# Patient Record
Sex: Female | Born: 1985 | Race: Black or African American | Hispanic: No | Marital: Single | Smoking: Current every day smoker
Health system: Southern US, Community
[De-identification: ages and names within clinical notes are randomized; demographics above are authoritative.]

## PROBLEM LIST (undated history)

## (undated) DIAGNOSIS — E669 Obesity, unspecified: Secondary | ICD-10-CM

## (undated) DIAGNOSIS — F319 Bipolar disorder, unspecified: Secondary | ICD-10-CM

## (undated) DIAGNOSIS — F419 Anxiety disorder, unspecified: Secondary | ICD-10-CM

## (undated) DIAGNOSIS — F259 Schizoaffective disorder, unspecified: Secondary | ICD-10-CM

## (undated) DIAGNOSIS — F250 Schizoaffective disorder, bipolar type: Secondary | ICD-10-CM

## (undated) HISTORY — PX: DENTAL SURGERY: SHX609

---
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
## 2005-12-23 ENCOUNTER — Emergency Department (HOSPITAL_COMMUNITY): Admission: EM | Admit: 2005-12-23 | Discharge: 2005-12-23 | Payer: Self-pay | Admitting: *Deleted

## 2007-10-11 ENCOUNTER — Emergency Department (HOSPITAL_COMMUNITY): Admission: EM | Admit: 2007-10-11 | Discharge: 2007-10-11 | Payer: Self-pay | Admitting: Emergency Medicine

## 2008-07-15 ENCOUNTER — Emergency Department (HOSPITAL_COMMUNITY): Admission: EM | Admit: 2008-07-15 | Discharge: 2008-07-15 | Payer: Self-pay | Admitting: Emergency Medicine

## 2009-03-03 ENCOUNTER — Emergency Department (HOSPITAL_COMMUNITY): Admission: EM | Admit: 2009-03-03 | Discharge: 2009-03-03 | Payer: Self-pay | Admitting: Emergency Medicine

## 2009-03-14 ENCOUNTER — Emergency Department (HOSPITAL_COMMUNITY): Admission: EM | Admit: 2009-03-14 | Discharge: 2009-03-14 | Payer: Self-pay | Admitting: Family Medicine

## 2009-04-12 ENCOUNTER — Emergency Department (HOSPITAL_COMMUNITY): Admission: EM | Admit: 2009-04-12 | Discharge: 2009-04-13 | Payer: Self-pay | Admitting: Emergency Medicine

## 2009-07-12 ENCOUNTER — Emergency Department (HOSPITAL_COMMUNITY): Admission: EM | Admit: 2009-07-12 | Discharge: 2009-07-13 | Payer: Self-pay | Admitting: Emergency Medicine

## 2011-01-27 LAB — URINE MICROSCOPIC-ADD ON

## 2011-01-27 LAB — WET PREP, GENITAL
Trich, Wet Prep: NONE SEEN
Yeast Wet Prep HPF POC: NONE SEEN

## 2011-01-27 LAB — RPR: RPR Ser Ql: NONREACTIVE

## 2011-01-27 LAB — URINALYSIS, ROUTINE W REFLEX MICROSCOPIC
Specific Gravity, Urine: 1.027 (ref 1.005–1.030)
pH: 6 (ref 5.0–8.0)

## 2011-01-27 LAB — POCT PREGNANCY, URINE: Preg Test, Ur: NEGATIVE

## 2011-01-27 LAB — GC/CHLAMYDIA PROBE AMP, GENITAL: Chlamydia, DNA Probe: POSITIVE — AB

## 2011-01-30 LAB — URINALYSIS, ROUTINE W REFLEX MICROSCOPIC
Glucose, UA: NEGATIVE mg/dL
Nitrite: NEGATIVE
Protein, ur: NEGATIVE mg/dL
Urobilinogen, UA: 0.2 mg/dL (ref 0.0–1.0)

## 2011-01-30 LAB — POCT PREGNANCY, URINE: Preg Test, Ur: NEGATIVE

## 2011-01-31 LAB — CBC
MCHC: 35 g/dL (ref 30.0–36.0)
MCV: 94.4 fL (ref 78.0–100.0)
RBC: 3.66 MIL/uL — ABNORMAL LOW (ref 3.87–5.11)
WBC: 6 10*3/uL (ref 4.0–10.5)

## 2011-01-31 LAB — DIFFERENTIAL
Basophils Absolute: 0 10*3/uL (ref 0.0–0.1)
Eosinophils Absolute: 0 10*3/uL (ref 0.0–0.7)
Lymphs Abs: 1.7 10*3/uL (ref 0.7–4.0)
Neutro Abs: 3.6 10*3/uL (ref 1.7–7.7)

## 2011-01-31 LAB — COMPREHENSIVE METABOLIC PANEL
AST: 32 U/L (ref 0–37)
Albumin: 3.8 g/dL (ref 3.5–5.2)
BUN: 6 mg/dL (ref 6–23)
CO2: 25 mEq/L (ref 19–32)
GFR calc Af Amer: >60 mL/min (ref >60)
GFR calc non Af Amer: >60 mL/min (ref >60)
Glucose, Bld: 83 mg/dL (ref 70–99)
Total Protein: 6.5 g/dL (ref 6.0–8.3)

## 2011-01-31 LAB — URINALYSIS, ROUTINE W REFLEX MICROSCOPIC
Glucose, UA: NEGATIVE mg/dL
Nitrite: NEGATIVE

## 2011-01-31 LAB — LIPASE, BLOOD: Lipase: 23 U/L (ref 11–59)

## 2011-07-28 LAB — URINALYSIS, ROUTINE W REFLEX MICROSCOPIC
Bilirubin Urine: NEGATIVE
Glucose, UA: NEGATIVE
Ketones, ur: NEGATIVE
Leukocytes, UA: NEGATIVE
Nitrite: NEGATIVE
Specific Gravity, Urine: 1.021
Urobilinogen, UA: 1

## 2011-07-28 LAB — GC/CHLAMYDIA PROBE AMP, GENITAL: GC Probe Amp, Genital: NEGATIVE

## 2011-07-28 LAB — POCT PREGNANCY, URINE
Operator id: 285841
Preg Test, Ur: NEGATIVE

## 2011-11-16 ENCOUNTER — Emergency Department (HOSPITAL_COMMUNITY)
Admission: EM | Admit: 2011-11-16 | Discharge: 2011-11-16 | Disposition: A | Discharge status: Home / Self Care | Payer: Medicaid Other | Attending: Emergency Medicine | Admitting: Emergency Medicine

## 2011-11-16 ENCOUNTER — Encounter (HOSPITAL_COMMUNITY): Payer: Self-pay | Admitting: Emergency Medicine

## 2011-11-16 DIAGNOSIS — E669 Obesity, unspecified: Secondary | ICD-10-CM | POA: Insufficient documentation

## 2011-11-16 DIAGNOSIS — L02411 Cutaneous abscess of right axilla: Secondary | ICD-10-CM

## 2011-11-16 DIAGNOSIS — IMO0002 Reserved for concepts with insufficient information to code with codable children: Secondary | ICD-10-CM | POA: Insufficient documentation

## 2011-11-16 MED ORDER — IBUPROFEN 200 MG PO TABS
600.0000 mg | ORAL_TABLET | Freq: Once | ORAL | Status: AC
  Administered 2011-11-16: 600 mg via ORAL
  Filled 2011-11-16: qty 3

## 2011-11-16 MED ORDER — DOXYCYCLINE HYCLATE 100 MG PO CAPS: 100.0000 mg | ORAL_CAPSULE | Freq: Two times a day (BID) | ORAL | Status: AC

## 2011-11-16 NOTE — ED Provider Notes (Signed)
History     CSN: [id number]  Arrival date & time 11/16/11  2004   First MD Initiated Contact with Patient 11/16/11 2017      Chief Complaint  Patient presents with  . Abscess     Patient is a 26 y.o. female presenting with abscess.  Abscess  Pertinent negatives include no fever.   History provided by the patient. Patient is a 26 year old female with no significant past medical history who presents with complaints of boil to her right axilla over the past 4-5 days. Patient reports having gradual increase of pain and swelling to the area. She does report some episodes of drainage and bleeding to the area with improvement of symptoms however she reports pain and swelling has route increased over the past few days. Patient denies any fever, chills, sweats, nausea or vomiting. Patient does report having some soreness in her shoulder and having a headache today. Patient has not taken any medications for her symptoms. Pain is aggravated with movements of arms and palpitations to the area. Patient denies any alleviating factors. Patient has no other significant medical history or complaints.    History reviewed. No pertinent past medical history.  History reviewed. No pertinent past surgical history.  No family history on file.  History  Substance Use Topics  . Smoking status: Never Smoker   . Smokeless tobacco: Not on file  . Alcohol Use: No    OB History    Grav Para Term Preterm Abortions TAB SAB Ect Mult Living                  Review of Systems  Constitutional: Negative for fever and chills.  All other systems reviewed and are negative.    Allergies  Vicodin  Home Medications  No current outpatient prescriptions on file.  BP 114/64  Pulse 108  Temp(Src) 98.1 F (36.7 C) (Oral)  Resp 18  SpO2 100%  LMP 10/24/2011  Physical Exam  Nursing note and vitals reviewed. Constitutional: She is oriented to person, place, and time. She appears well-developed and  well-nourished. No distress.  HENT:  Head: Normocephalic.  Cardiovascular: Normal rate and regular rhythm.   Pulmonary/Chest: Effort normal and breath sounds normal.  Abdominal:       Obese  Neurological: She is alert and oriented to person, place, and time.  Skin: Skin is warm and dry.       3 cm area of induration to right axilla. There is a central area with fluctuance. No active drainage or bleeding.  Psychiatric: She has a normal mood and affect. Her behavior is normal.    ED Course  Procedures   INCISION AND DRAINAGE Performed by: Angus Seller Consent: Verbal consent obtained. Risks and benefits: risks, benefits and alternatives were discussed Type: abscess  Body area: Right axilla  Anesthesia: local infiltration  Local anesthetic: lidocaine 2 % with epinephrine  Anesthetic total: 4 ml  Complexity: complex Blunt dissection to break up loculations  Drainage: purulent  Drainage amount: Moderate   Packing material: 1/4 in iodoform gauze  Patient tolerance: Patient tolerated the procedure well with no immediate complications.       1. Abscess of right axilla       MDM  Patient seen and evaluated. Patient no acute distress.        Angus Seller, [place] 11/17/11 [phone]

## 2011-11-16 NOTE — ED Notes (Signed)
PT. REPORTS ABSCESS AT RIGHT AXILLA FOR 4 DAYS WITH DRAINAGE .

## 2011-11-18 NOTE — ED Provider Notes (Signed)
Medical screening examination/treatment/procedure(s) were performed by non-physician practitioner and as supervising physician I was immediately available for consultation/collaboration.  Gale Hulse, MD 11/18/11 0712 

## 2013-10-03 ENCOUNTER — Ambulatory Visit: Payer: Self-pay | Admitting: Advanced Practice Midwife

## 2013-10-31 ENCOUNTER — Ambulatory Visit: Payer: Self-pay | Admitting: Advanced Practice Midwife

## 2013-12-23 ENCOUNTER — Encounter (HOSPITAL_COMMUNITY): Payer: Self-pay | Admitting: Emergency Medicine

## 2013-12-23 ENCOUNTER — Emergency Department (HOSPITAL_COMMUNITY)
Admission: EM | Admit: 2013-12-23 | Discharge: 2013-12-23 | Disposition: A | Discharge status: Home / Self Care | Payer: Medicare Other | Attending: Emergency Medicine | Admitting: Emergency Medicine

## 2013-12-23 DIAGNOSIS — R1013 Epigastric pain: Secondary | ICD-10-CM | POA: Insufficient documentation

## 2013-12-23 DIAGNOSIS — N898 Other specified noninflammatory disorders of vagina: Secondary | ICD-10-CM | POA: Insufficient documentation

## 2013-12-23 DIAGNOSIS — Z3202 Encounter for pregnancy test, result negative: Secondary | ICD-10-CM | POA: Insufficient documentation

## 2013-12-23 DIAGNOSIS — R102 Pelvic and perineal pain: Secondary | ICD-10-CM

## 2013-12-23 LAB — WET PREP, GENITAL
Clue Cells Wet Prep HPF POC: NONE SEEN
Trich, Wet Prep: NONE SEEN
YEAST WET PREP: NONE SEEN

## 2013-12-23 LAB — CBC WITH DIFFERENTIAL/PLATELET
BASOS ABS: 0 10*3/uL (ref 0.0–0.1)
BASOS PCT: 0 % (ref 0–1)
EOS ABS: 0.1 10*3/uL (ref 0.0–0.7)
EOS PCT: 1 % (ref 0–5)
HEMATOCRIT: 36.7 % (ref 36.0–46.0)
HEMOGLOBIN: 12.7 g/dL (ref 12.0–15.0)
Lymphocytes Relative: 34 % (ref 12–46)
Lymphs Abs: 2 10*3/uL (ref 0.7–4.0)
MCH: 31 pg (ref 26.0–34.0)
MCHC: 34.6 g/dL (ref 30.0–36.0)
MCV: 89.5 fL (ref 78.0–100.0)
MONO ABS: 0.5 10*3/uL (ref 0.1–1.0)
MONOS PCT: 9 % (ref 3–12)
NEUTROS ABS: 3.3 10*3/uL (ref 1.7–7.7)
Neutrophils Relative %: 56 % (ref 43–77)
Platelets: 320 10*3/uL (ref 150–400)
RBC: 4.1 MIL/uL (ref 3.87–5.11)
RDW: 13.2 % (ref 11.5–15.5)
WBC: 5.9 10*3/uL (ref 4.0–10.5)

## 2013-12-23 LAB — URINALYSIS, ROUTINE W REFLEX MICROSCOPIC
BILIRUBIN URINE: NEGATIVE
Glucose, UA: NEGATIVE mg/dL
HGB URINE DIPSTICK: NEGATIVE
Ketones, ur: NEGATIVE mg/dL
Leukocytes, UA: NEGATIVE
Nitrite: NEGATIVE
PH: 7 (ref 5.0–8.0)
PROTEIN: NEGATIVE mg/dL
SPECIFIC GRAVITY, URINE: 1.023 (ref 1.005–1.030)
UROBILINOGEN UA: 0.2 mg/dL (ref 0.0–1.0)

## 2013-12-23 LAB — BASIC METABOLIC PANEL
BUN: 10 mg/dL (ref 6–23)
CHLORIDE: 103 meq/L (ref 96–112)
CO2: 27 mEq/L (ref 19–32)
CREATININE: 0.67 mg/dL (ref 0.50–1.10)
Calcium: 9.3 mg/dL (ref 8.4–10.5)
GFR calc Af Amer: >90 mL/min (ref >90)
Glucose, Bld: 84 mg/dL (ref 70–99)
Potassium: 4.3 mEq/L (ref 3.7–5.3)
Sodium: 142 mEq/L (ref 137–147)

## 2013-12-23 LAB — POC URINE PREG, ED: Preg Test, Ur: NEGATIVE

## 2013-12-23 LAB — HIV ANTIBODY (ROUTINE TESTING W REFLEX): HIV: NONREACTIVE

## 2013-12-23 MED ORDER — SODIUM CHLORIDE 0.9 % IV BOLUS (SEPSIS)
1000.0000 mL | Freq: Once | INTRAVENOUS | Status: AC
  Administered 2013-12-23: 1000 mL via INTRAVENOUS

## 2013-12-23 MED ORDER — MORPHINE SULFATE 4 MG/ML IJ SOLN
4.0000 mg | Freq: Once | INTRAMUSCULAR | Status: AC
  Administered 2013-12-23: 4 mg via INTRAVENOUS
  Filled 2013-12-23: qty 1

## 2013-12-23 MED ORDER — METRONIDAZOLE 500 MG PO TABS
2000.0000 mg | ORAL_TABLET | Freq: Once | ORAL | Status: AC
  Administered 2013-12-23: 2000 mg via ORAL
  Filled 2013-12-23: qty 4

## 2013-12-23 MED ORDER — HYDROCODONE-ACETAMINOPHEN 5-325 MG PO TABS: 1.0000 | ORAL_TABLET | ORAL | Status: DC | PRN

## 2013-12-23 MED ORDER — ONDANSETRON HCL 4 MG PO TABS: 4.0000 mg | ORAL_TABLET | Freq: Four times a day (QID) | ORAL | Status: DC

## 2013-12-23 MED ORDER — ONDANSETRON HCL 4 MG/2ML IJ SOLN
4.0000 mg | Freq: Once | INTRAMUSCULAR | Status: AC
Start: 2013-12-23 — End: 2013-12-23
  Administered 2013-12-23: 4 mg via INTRAVENOUS
  Filled 2013-12-23: qty 2

## 2013-12-23 MED ORDER — OXYCODONE-ACETAMINOPHEN 5-325 MG PO TABS: 1.0000 | ORAL_TABLET | Freq: Four times a day (QID) | ORAL | Status: DC | PRN

## 2013-12-23 MED ORDER — AZITHROMYCIN 250 MG PO TABS
1000.0000 mg | ORAL_TABLET | Freq: Once | ORAL | Status: AC
  Administered 2013-12-23: 1000 mg via ORAL
  Filled 2013-12-23: qty 4

## 2013-12-23 MED ORDER — STERILE WATER FOR INJECTION IJ SOLN
INTRAMUSCULAR | Status: AC
  Administered 2013-12-23: 0.9 mL
  Filled 2013-12-23: qty 10

## 2013-12-23 MED ORDER — CEFTRIAXONE SODIUM 250 MG IJ SOLR
250.0000 mg | Freq: Once | INTRAMUSCULAR | Status: AC
  Administered 2013-12-23: 250 mg via INTRAMUSCULAR
  Filled 2013-12-23: qty 250

## 2013-12-23 NOTE — ED Provider Notes (Signed)
CSN: [id number]     Arrival date & time 12/23/13  1018 History   First MD Initiated Contact with Patient 12/23/13 1120     Chief Complaint  Patient presents with  . Urinary Tract Infection  . Flank Pain     (Consider location/radiation/quality/duration/timing/severity/associated sxs/prior Treatment) HPI  She presents to the emergency department for evaluation of her flank pain and suprapubic pain on and off for the past 2 months. She reports that she thinks she has a UTI that has moved up into her kidneys. She has been having intermittent vaginal bleeding that is irregular. She admits that she has been in a new relationship over the past 2 months and is not on birth control and has not been using protection. She denies having any abnormal vaginal discharge. She has had intermittent dysuria. She feels as though she has been more tired than normal but denies having any nausea, vomiting, diarrhea, fevers, chills, upper abdominal pains, headaches and confusion.  History reviewed. No pertinent past medical history. History reviewed. No pertinent past surgical history. No family history on file. History  Substance Use Topics  . Smoking status: Never Smoker   . Smokeless tobacco: Not on file  . Alcohol Use: No   OB History   Grav Para Term Preterm Abortions TAB SAB Ect Mult Living                 Review of Systems  The patient denies anorexia, fever, weight loss,, vision loss, decreased hearing, hoarseness, chest pain, syncope, dyspnea on exertion, peripheral edema, balance deficits, hemoptysis, melena, hematochezia, severe indigestion/heartburn, hematuria, incontinence, genital sores, muscle weakness, suspicious skin lesions, transient blindness, difficulty walking, depression, unusual weight change,  enlarged lymph nodes, angioedema, and breast masses.   Allergies  Vicodin  Home Medications   Current Outpatient Rx  Name  Route  Sig  Dispense  Refill  . ibuprofen (ADVIL,MOTRIN) 200  MG tablet   Oral   Take 400 mg by mouth every 6 (six) hours as needed.         . ondansetron (ZOFRAN) 4 MG tablet   Oral   Take 1 tablet (4 mg total) by mouth every 6 (six) hours.   12 tablet   0   . oxyCODONE-acetaminophen (PERCOCET/ROXICET) 5-325 MG per tablet   Oral   Take 1 tablet by mouth every 6 (six) hours as needed for severe pain.   15 tablet   0    BP 107/70  Pulse 71  Temp(Src) 97.7 F (36.5 C) (Oral)  Resp 18  Wt 196 lb (88.905 kg)  SpO2 100%  LMP 11/21/2013 Physical Exam  Nursing note and vitals reviewed. Constitutional: She appears well-developed and well-nourished. No distress.  HENT:  Head: Normocephalic and atraumatic.  Eyes: Pupils are equal, round, and reactive to light.  Neck: Normal range of motion. Neck supple.  Cardiovascular: Normal rate and regular rhythm.   Pulmonary/Chest: Effort normal.  Abdominal: Soft. Bowel sounds are normal. She exhibits no ascites. There is tenderness in the epigastric area. There is no guarding and no CVA tenderness.  Exam limited by body habitus  Genitourinary: Uterus normal. Uterus is not enlarged and not tender. Cervix exhibits discharge. Cervix exhibits no motion tenderness and no friability. Right adnexum displays no mass, no tenderness and no fullness. Left adnexum displays tenderness. Left adnexum displays no mass and no fullness. Vaginal discharge found.  Neurological: She is alert.  Skin: Skin is warm and dry.    ED Course  Procedures (  including critical care time) Labs Review Labs Reviewed  WET PREP, GENITAL - Abnormal; Notable for the following:    WBC, Wet Prep HPF POC FEW (*)    All other components within normal limits  GC/CHLAMYDIA PROBE AMP  URINALYSIS, ROUTINE W REFLEX MICROSCOPIC  CBC WITH DIFFERENTIAL  BASIC METABOLIC PANEL  HIV ANTIBODY (ROUTINE TESTING)  POC URINE PREG, ED   Imaging Review No results found.   EKG Interpretation None      MDM   Final diagnoses:  Suprapubic pain    Patients blood work comes back  Blood work has resulted without any acute abnormalities. Her Wet prep shows some WBC, GC and HIV antibody is pending. She has been treated with Azithromycin, Flagyl, and Rocephin.   She is feeling much better after 1 dose of pain medication. Made aware that if any of her cultures return positive she will be notified.   27 y.o.Sabrina Galloway's evaluation in the Emergency Department is complete. It has been determined that no acute conditions requiring further emergency intervention are present at this time. The patient/guardian have been advised of the diagnosis and plan. We have discussed signs and symptoms that warrant return to the ED, such as changes or worsening in symptoms.  Vital signs are stable at discharge. Filed Vitals:   12/23/13 1345  BP: 107/70  Pulse: 71  Temp: 97.7 F (36.5 C)  Resp: 18    Patient/guardian has voiced understanding and agreed to follow-up with the PCP or specialist.         Dorthula Matas, PA-C 12/23/13 1439

## 2013-12-23 NOTE — ED Provider Notes (Signed)
Medical screening examination/treatment/procedure(s) were performed by non-physician practitioner and as supervising physician I was immediately available for consultation/collaboration.  Flint MelterElliott L Georgeanna Radziewicz, MD 12/23/13 2043

## 2013-12-23 NOTE — ED Notes (Signed)
States has had low back pain on and off for two months, states "I think i have a UTI--and I have irregular vag bleeding. Have been really tired"

## 2013-12-23 NOTE — Discharge Instructions (Signed)
Abdominal Pain, Women °Abdominal (stomach, pelvic, or belly) pain can be caused by many things. It is important to tell your doctor: °· The location of the pain. °· Does it come and go or is it present all the time? °· Are there things that start the pain (eating certain foods, exercise)? °· Are there other symptoms associated with the pain (fever, nausea, vomiting, diarrhea)? °All of this is helpful to know when trying to find the cause of the pain. °CAUSES  °· Stomach: virus or bacteria infection, or ulcer. °· Intestine: appendicitis (inflamed appendix), regional ileitis (Crohn's disease), ulcerative colitis (inflamed colon), irritable bowel syndrome, diverticulitis (inflamed diverticulum of the colon), or cancer of the stomach or intestine. °· Gallbladder disease or stones in the gallbladder. °· Kidney disease, kidney stones, or infection. °· Pancreas infection or cancer. °· Fibromyalgia (pain disorder). °· Diseases of the female organs: °· Uterus: fibroid (non-cancerous) tumors or infection. °· Fallopian tubes: infection or tubal pregnancy. °· Ovary: cysts or tumors. °· Pelvic adhesions (scar tissue). °· Endometriosis (uterus lining tissue growing in the pelvis and on the pelvic organs). °· Pelvic congestion syndrome (female organs filling up with blood just before the menstrual period). °· Pain with the menstrual period. °· Pain with ovulation (producing an egg). °· Pain with an IUD (intrauterine device, birth control) in the uterus. °· Cancer of the female organs. °· Functional pain (pain not caused by a disease, may improve without treatment). °· Psychological pain. °· Depression. °DIAGNOSIS  °Your doctor will decide the seriousness of your pain by doing an examination. °· Blood tests. °· X-rays. °· Ultrasound. °· CT scan (computed tomography, special type of X-ray). °· MRI (magnetic resonance imaging). °· Cultures, for infection. °· Barium enema (dye inserted in the large intestine, to better view it with  X-rays). °· Colonoscopy (looking in intestine with a lighted tube). °· Laparoscopy (minor surgery, looking in abdomen with a lighted tube). °· Major abdominal exploratory surgery (looking in abdomen with a large incision). °TREATMENT  °The treatment will depend on the cause of the pain.  °· Many cases can be observed and treated at home. °· Over-the-counter medicines recommended by your caregiver. °· Prescription medicine. °· Antibiotics, for infection. °· Birth control pills, for painful periods or for ovulation pain. °· Hormone treatment, for endometriosis. °· Nerve blocking injections. °· Physical therapy. °· Antidepressants. °· Counseling with a psychologist or psychiatrist. °· Minor or major surgery. °HOME CARE INSTRUCTIONS  °· Do not take laxatives, unless directed by your caregiver. °· Take over-the-counter pain medicine only if ordered by your caregiver. Do not take aspirin because it can cause an upset stomach or bleeding. °· Try a clear liquid diet (broth or water) as ordered by your caregiver. Slowly move to a bland diet, as tolerated, if the pain is related to the stomach or intestine. °· Have a thermometer and take your temperature several times a day, and record it. °· Bed rest and sleep, if it helps the pain. °· Avoid sexual intercourse, if it causes pain. °· Avoid stressful situations. °· Keep your follow-up appointments and tests, as your caregiver orders. °· If the pain does not go away with medicine or surgery, you may try: °· Acupuncture. °· Relaxation exercises (yoga, meditation). °· Group therapy. °· Counseling. °SEEK MEDICAL CARE IF:  °· You notice certain foods cause stomach pain. °· Your home care treatment is not helping your pain. °· You need stronger pain medicine. °· You want your IUD removed. °· You feel faint or   lightheaded. °· You develop nausea and vomiting. °· You develop a rash. °· You are having side effects or an allergy to your medicine. °SEEK IMMEDIATE MEDICAL CARE IF:  °· Your  pain does not go away or gets worse. °· You have a fever. °· Your pain is felt only in portions of the abdomen. The right side could possibly be appendicitis. The left lower portion of the abdomen could be colitis or diverticulitis. °· You are passing blood in your stools (bright red or black tarry stools, with or without vomiting). °· You have blood in your urine. °· You develop chills, with or without a fever. °· You pass out. °MAKE SURE YOU:  °· Understand these instructions. °· Will watch your condition. °· Will get help right away if you are not doing well or get worse. °Document Released: 08/06/2007 Document Revised: 01/01/2012 Document Reviewed: 08/26/2009 °[employer]® Patient Information ©2014 [employer]. ° °

## 2013-12-24 LAB — GC/CHLAMYDIA PROBE AMP
CT Probe RNA: NEGATIVE
GC Probe RNA: NEGATIVE

## 2014-01-09 ENCOUNTER — Ambulatory Visit: Payer: Self-pay | Admitting: Advanced Practice Midwife

## 2014-01-27 ENCOUNTER — Ambulatory Visit: Payer: Medicaid Other | Admitting: Advanced Practice Midwife

## 2014-05-19 ENCOUNTER — Emergency Department (HOSPITAL_COMMUNITY)
Admission: EM | Admit: 2014-05-19 | Discharge: 2014-05-19 | Discharge status: Against Medical Advice | Payer: Medicare Other | Attending: Emergency Medicine | Admitting: Emergency Medicine

## 2014-05-19 ENCOUNTER — Encounter (HOSPITAL_COMMUNITY): Payer: Self-pay | Admitting: Emergency Medicine

## 2014-05-19 DIAGNOSIS — E669 Obesity, unspecified: Secondary | ICD-10-CM | POA: Insufficient documentation

## 2014-05-19 DIAGNOSIS — R109 Unspecified abdominal pain: Secondary | ICD-10-CM | POA: Insufficient documentation

## 2014-05-19 HISTORY — DX: Obesity, unspecified: E66.9

## 2014-05-19 NOTE — ED Notes (Addendum)
Pt reports lower abd/pelvic pain for over a month. Denies difficulty urinating, denies vaginal symptoms. Also having n/v, swelling to feet and milk coming from her breast. Pt thinks due to symptoms that she is pregnant but had two negative preg test. Having irregular menstrual cycles, bleeding now.

## 2014-05-19 NOTE — ED Notes (Signed)
Pager found in waiting room; no answer.

## 2015-05-14 ENCOUNTER — Emergency Department (HOSPITAL_COMMUNITY)
Admission: EM | Admit: 2015-05-14 | Discharge: 2015-05-14 | Disposition: A | Discharge status: Home / Self Care | Payer: Medicare Other | Attending: Emergency Medicine | Admitting: Emergency Medicine

## 2015-05-14 ENCOUNTER — Encounter (HOSPITAL_COMMUNITY): Payer: Self-pay | Admitting: Emergency Medicine

## 2015-05-14 DIAGNOSIS — J029 Acute pharyngitis, unspecified: Secondary | ICD-10-CM | POA: Diagnosis present

## 2015-05-14 DIAGNOSIS — J069 Acute upper respiratory infection, unspecified: Secondary | ICD-10-CM | POA: Insufficient documentation

## 2015-05-14 DIAGNOSIS — B9789 Other viral agents as the cause of diseases classified elsewhere: Secondary | ICD-10-CM

## 2015-05-14 DIAGNOSIS — E669 Obesity, unspecified: Secondary | ICD-10-CM | POA: Diagnosis not present

## 2015-05-14 DIAGNOSIS — J3489 Other specified disorders of nose and nasal sinuses: Secondary | ICD-10-CM

## 2015-05-14 MED ORDER — GUAIFENESIN ER 600 MG PO TB12: 1200.0000 mg | ORAL_TABLET | Freq: Two times a day (BID) | ORAL | Status: DC

## 2015-05-14 MED ORDER — BENZONATATE 100 MG PO CAPS: 200.0000 mg | ORAL_CAPSULE | Freq: Two times a day (BID) | ORAL | Status: DC | PRN

## 2015-05-14 MED ORDER — FLUTICASONE PROPIONATE 50 MCG/ACT NA SUSP: 2.0000 | Freq: Every day | NASAL | Status: DC

## 2015-05-14 NOTE — ED Provider Notes (Signed)
CSN: [id number]     Arrival date & time 05/14/15  1408 History  This chart was scribed for non-physician practitioner, Dierdre Forth, PA-C working with Mancel Bale, MD by Gwenyth Ober, ED scribe. This patient was seen in room TR10C/TR10C and the patient's care was started at 2:57 PM   Chief Complaint  Patient presents with  . Sore Throat  . Nasal Congestion   The history is provided by the patient. No language interpreter was used.   HPI Comments: Sabrina Galloway is a 29 y.o. female who presents to the Emergency Department complaining of constant, moderate nasal congestion that started 3 days ago. She states productive cough with yellow sputum, subjective fever, chills and sore throat as associated symptoms. Pt tried OTC Cold and Cough medicine with no relief. Pt denies a history of allergies, smoking and recent sick contact. She also denies dental pain as an associated symptom.  Past Medical History  Diagnosis Date  . Obesity    History reviewed. No pertinent past surgical history. No family history on file. History  Substance Use Topics  . Smoking status: Never Smoker   . Smokeless tobacco: Not on file  . Alcohol Use: No   OB History    No data available     Review of Systems  Constitutional: Positive for chills and fatigue. Negative for fever and appetite change.  HENT: Positive for congestion, postnasal drip, rhinorrhea and sore throat. Negative for dental problem, ear discharge, ear pain and mouth sores.   Eyes: Negative for visual disturbance.  Respiratory: Positive for cough. Negative for chest tightness, shortness of breath, wheezing and stridor.   Cardiovascular: Negative for chest pain, palpitations and leg swelling.  Gastrointestinal: Negative for nausea, vomiting, abdominal pain and diarrhea.  Genitourinary: Negative for dysuria, urgency, frequency and hematuria.  Musculoskeletal: Negative for myalgias, back pain, arthralgias and neck stiffness.  Skin:  Negative for rash.  Neurological: Negative for syncope, light-headedness and numbness.  Hematological: Negative for adenopathy.  Psychiatric/Behavioral: The patient is not nervous/anxious.       Allergies  Vicodin  Home Medications   Prior to Admission medications   Medication Sig Start Date End Date Taking? Authorizing Provider  benzonatate (TESSALON) 100 MG capsule Take 2 capsules (200 mg total) by mouth 2 (two) times daily as needed for cough. 05/14/15   Kaitlin Alcindor, PA-C  fluticasone (FLONASE) 50 MCG/ACT nasal spray Place 2 sprays into both nostrils daily. 05/14/15   Yaeko Fazekas, PA-C  guaiFENesin (MUCINEX) 600 MG 12 hr tablet Take 2 tablets (1,200 mg total) by mouth 2 (two) times daily. 05/14/15   Deserai Cansler, PA-C  ibuprofen (ADVIL,MOTRIN) 200 MG tablet Take 400 mg by mouth every 6 (six) hours as needed for mild pain.     Historical Provider, MD   BP 123/90 mmHg  Pulse 90  Temp(Src) 97.9 F (36.6 C) (Oral)  Resp 18  SpO2 100%  LMP 04/14/2015 Physical Exam  Constitutional: She is oriented to person, place, and time. She appears well-developed and well-nourished. No distress.  HENT:  Head: Normocephalic and atraumatic.  Right Ear: Tympanic membrane, external ear and ear canal normal.  Left Ear: Tympanic membrane, external ear and ear canal normal.  Nose: Mucosal edema and rhinorrhea present. No epistaxis. Right sinus exhibits no maxillary sinus tenderness and no frontal sinus tenderness. Left sinus exhibits no maxillary sinus tenderness and no frontal sinus tenderness.  Mouth/Throat: Uvula is midline and mucous membranes are normal. Mucous membranes are not pale and not cyanotic. No  oropharyngeal exudate, posterior oropharyngeal edema, posterior oropharyngeal erythema or tonsillar abscesses.  Eyes: Conjunctivae are normal. Pupils are equal, round, and reactive to light.  Neck: Normal range of motion and full passive range of motion without pain.   Cardiovascular: Normal rate and intact distal pulses.   Pulmonary/Chest: Effort normal and breath sounds normal. No stridor.  Clear and equal breath sounds without focal wheezes, rhonchi, rales  Abdominal: Soft. Bowel sounds are normal. There is no tenderness.  Musculoskeletal: Normal range of motion.  Lymphadenopathy:    She has no cervical adenopathy.  Neurological: She is alert and oriented to person, place, and time.  Skin: Skin is warm and dry. No rash noted. She is not diaphoretic.  Psychiatric: She has a normal mood and affect.  Nursing note and vitals reviewed.   ED Course  Procedures  DIAGNOSTIC STUDIES: Oxygen Saturation is 95% on RA, normal by my interpretation.    COORDINATION OF CARE: 3:13 PM Discussed treatment plan with pt which includes nasal spray and cough medicine. Advised pt to take Mucinex and increase fluid intake. Pt agreed to plan.  Labs Review Labs Reviewed - No data to display  Imaging Review No results found.   EKG Interpretation None      MDM   Final diagnoses:  Viral URI with cough  Rhinorrhea   Sabrina Galloway presents with URI symptoms.  Pt CXR no indicated as pt is afebrile with clear and equal breath sounds.   Patients symptoms are consistent with URI, likely viral etiology. Discussed that antibiotics are not indicated for viral infections. Pt will be discharged with symptomatic treatment.  Verbalizes understanding and is agreeable with plan. Pt is hemodynamically stable & in NAD prior to dc.  BP 123/90 mmHg  Pulse 90  Temp(Src) 97.9 F (36.6 C) (Oral)  Resp 18  SpO2 100%  LMP 04/14/2015  I personally performed the services described in this documentation, which was scribed in my presence. The recorded information has been reviewed and is accurate.    Dahlia Client Corinthian Mizrahi, PA-C 05/14/15 1639  Mancel Bale, MD 05/15/15 1520

## 2015-05-14 NOTE — Discharge Instructions (Signed)
1. Medications: flonase, mucinex, tessalon, usual home medications 2. Treatment: rest, drink plenty of fluids, take tylenol or ibuprofen for fever control 3. Follow Up: Please followup with your primary doctor in 3 days for discussion of your diagnoses and further evaluation after today's visit; if you do not have a primary care doctor use the resource guide provided to find one; Return to the ER for high fevers, difficulty breathing or other concerning symptoms   Upper Respiratory Infection, Adult An upper respiratory infection (URI) is also sometimes known as the common cold. The upper respiratory tract includes the nose, sinuses, throat, trachea, and bronchi. Bronchi are the airways leading to the lungs. Most people improve within 1 week, but symptoms can last up to 2 weeks. A residual cough may last even longer.  CAUSES Many different viruses can infect the tissues lining the upper respiratory tract. The tissues become irritated and inflamed and often become very moist. Mucus production is also common. A cold is contagious. You can easily spread the virus to others by oral contact. This includes kissing, sharing a glass, coughing, or sneezing. Touching your mouth or nose and then touching a surface, which is then touched by another person, can also spread the virus. SYMPTOMS  Symptoms typically develop 1 to 3 days after you come in contact with a cold virus. Symptoms vary from person to person. They may include:  Runny nose.  Sneezing.  Nasal congestion.  Sinus irritation.  Sore throat.  Loss of voice (laryngitis).  Cough.  Fatigue.  Muscle aches.  Loss of appetite.  Headache.  Low-grade fever. DIAGNOSIS  You might diagnose your own cold based on familiar symptoms, since most people get a cold 2 to 3 times a year. Your caregiver can confirm this based on your exam. Most importantly, your caregiver can check that your symptoms are not due to another disease such as strep  throat, sinusitis, pneumonia, asthma, or epiglottitis. Blood tests, throat tests, and X-rays are not necessary to diagnose a common cold, but they may sometimes be helpful in excluding other more serious diseases. Your caregiver will decide if any further tests are required. RISKS AND COMPLICATIONS  You may be at risk for a more severe case of the common cold if you smoke cigarettes, have chronic heart disease (such as heart failure) or lung disease (such as asthma), or if you have a weakened immune system. The very young and very old are also at risk for more serious infections. Bacterial sinusitis, middle ear infections, and bacterial pneumonia can complicate the common cold. The common cold can worsen asthma and chronic obstructive pulmonary disease (COPD). Sometimes, these complications can require emergency medical care and may be life-threatening. PREVENTION  The best way to protect against getting a cold is to practice good hygiene. Avoid oral or hand contact with people with cold symptoms. Wash your hands often if contact occurs. There is no clear evidence that vitamin C, vitamin E, echinacea, or exercise reduces the chance of developing a cold. However, it is always recommended to get plenty of rest and practice good nutrition. TREATMENT  Treatment is directed at relieving symptoms. There is no cure. Antibiotics are not effective, because the infection is caused by a virus, not by bacteria. Treatment may include:  Increased fluid intake. Sports drinks offer valuable electrolytes, sugars, and fluids.  Breathing heated mist or steam (vaporizer or shower).  Eating chicken soup or other clear broths, and maintaining good nutrition.  Getting plenty of rest.  Using gargles  or lozenges for comfort.  Controlling fevers with ibuprofen or acetaminophen as directed by your caregiver.  Increasing usage of your inhaler if you have asthma. Zinc gel and zinc lozenges, taken in the first 24 hours of  the common cold, can shorten the duration and lessen the severity of symptoms. Pain medicines may help with fever, muscle aches, and throat pain. A variety of non-prescription medicines are available to treat congestion and runny nose. Your caregiver can make recommendations and may suggest nasal or lung inhalers for other symptoms.  HOME CARE INSTRUCTIONS   Only take over-the-counter or prescription medicines for pain, discomfort, or fever as directed by your caregiver.  Use a warm mist humidifier or inhale steam from a shower to increase air moisture. This may keep secretions moist and make it easier to breathe.  Drink enough water and fluids to keep your urine clear or pale yellow.  Rest as needed.  Return to work when your temperature has returned to normal or as your caregiver advises. You may need to stay home longer to avoid infecting others. You can also use a face mask and careful hand washing to prevent spread of the virus. SEEK MEDICAL CARE IF:   After the first few days, you feel you are getting worse rather than better.  You need your caregiver's advice about medicines to control symptoms.  You develop chills, worsening shortness of breath, or brown or red sputum. These may be signs of pneumonia.  You develop yellow or brown nasal discharge or pain in the face, especially when you bend forward. These may be signs of sinusitis.  You develop a fever, swollen neck glands, pain with swallowing, or white areas in the back of your throat. These may be signs of strep throat. SEEK IMMEDIATE MEDICAL CARE IF:   You have a fever.  You develop severe or persistent headache, ear pain, sinus pain, or chest pain.  You develop wheezing, a prolonged cough, cough up blood, or have a change in your usual mucus (if you have chronic lung disease).  You develop sore muscles or a stiff neck. Document Released: 04/04/2001 Document Revised: 01/01/2012 Document Reviewed: 01/14/2014 [facility]  Patient Information 2015 [place], [place]. This information is not intended to replace advice given to you by your health care provider. Make sure you discuss any questions you have with your health care provider.

## 2015-05-14 NOTE — ED Notes (Signed)
Pt. Stated, I have a bad sore throat, nasal stuff, Im hot and cold for about 3 days

## 2016-01-10 DIAGNOSIS — R0602 Shortness of breath: Secondary | ICD-10-CM | POA: Diagnosis not present

## 2016-01-10 DIAGNOSIS — N926 Irregular menstruation, unspecified: Secondary | ICD-10-CM | POA: Diagnosis not present

## 2016-01-10 DIAGNOSIS — R635 Abnormal weight gain: Secondary | ICD-10-CM | POA: Diagnosis not present

## 2016-01-10 DIAGNOSIS — Z720 Tobacco use: Secondary | ICD-10-CM | POA: Diagnosis not present

## 2016-01-10 DIAGNOSIS — F419 Anxiety disorder, unspecified: Secondary | ICD-10-CM | POA: Diagnosis not present

## 2016-03-30 DIAGNOSIS — F419 Anxiety disorder, unspecified: Secondary | ICD-10-CM | POA: Diagnosis not present

## 2016-03-30 DIAGNOSIS — R7303 Prediabetes: Secondary | ICD-10-CM | POA: Diagnosis not present

## 2016-03-30 DIAGNOSIS — E559 Vitamin D deficiency, unspecified: Secondary | ICD-10-CM | POA: Diagnosis not present

## 2016-03-30 DIAGNOSIS — Z720 Tobacco use: Secondary | ICD-10-CM | POA: Diagnosis not present

## 2016-03-30 DIAGNOSIS — Z5181 Encounter for therapeutic drug level monitoring: Secondary | ICD-10-CM | POA: Diagnosis not present

## 2016-03-30 DIAGNOSIS — M545 Low back pain: Secondary | ICD-10-CM | POA: Diagnosis not present

## 2016-03-30 DIAGNOSIS — D500 Iron deficiency anemia secondary to blood loss (chronic): Secondary | ICD-10-CM | POA: Diagnosis not present

## 2016-06-08 DIAGNOSIS — F419 Anxiety disorder, unspecified: Secondary | ICD-10-CM | POA: Diagnosis not present

## 2016-06-08 DIAGNOSIS — R7303 Prediabetes: Secondary | ICD-10-CM | POA: Diagnosis not present

## 2016-06-08 DIAGNOSIS — D500 Iron deficiency anemia secondary to blood loss (chronic): Secondary | ICD-10-CM | POA: Diagnosis not present

## 2016-06-08 DIAGNOSIS — E559 Vitamin D deficiency, unspecified: Secondary | ICD-10-CM | POA: Diagnosis not present

## 2016-06-08 DIAGNOSIS — Z720 Tobacco use: Secondary | ICD-10-CM | POA: Diagnosis not present

## 2016-06-08 DIAGNOSIS — Z9189 Other specified personal risk factors, not elsewhere classified: Secondary | ICD-10-CM | POA: Diagnosis not present

## 2016-06-22 DIAGNOSIS — Z9189 Other specified personal risk factors, not elsewhere classified: Secondary | ICD-10-CM | POA: Diagnosis not present

## 2016-06-22 DIAGNOSIS — D500 Iron deficiency anemia secondary to blood loss (chronic): Secondary | ICD-10-CM | POA: Diagnosis not present

## 2016-06-22 DIAGNOSIS — Z720 Tobacco use: Secondary | ICD-10-CM | POA: Diagnosis not present

## 2016-06-22 DIAGNOSIS — M545 Low back pain: Secondary | ICD-10-CM | POA: Diagnosis not present

## 2016-06-22 DIAGNOSIS — E559 Vitamin D deficiency, unspecified: Secondary | ICD-10-CM | POA: Diagnosis not present

## 2016-06-22 DIAGNOSIS — R7303 Prediabetes: Secondary | ICD-10-CM | POA: Diagnosis not present

## 2016-06-22 DIAGNOSIS — F419 Anxiety disorder, unspecified: Secondary | ICD-10-CM | POA: Diagnosis not present

## 2016-10-19 DIAGNOSIS — F250 Schizoaffective disorder, bipolar type: Secondary | ICD-10-CM | POA: Diagnosis not present

## 2016-10-25 DIAGNOSIS — F250 Schizoaffective disorder, bipolar type: Secondary | ICD-10-CM | POA: Diagnosis not present

## 2016-10-27 DIAGNOSIS — F250 Schizoaffective disorder, bipolar type: Secondary | ICD-10-CM | POA: Diagnosis not present

## 2016-10-31 DIAGNOSIS — F250 Schizoaffective disorder, bipolar type: Secondary | ICD-10-CM | POA: Diagnosis not present

## 2016-11-02 DIAGNOSIS — F250 Schizoaffective disorder, bipolar type: Secondary | ICD-10-CM | POA: Diagnosis not present

## 2016-11-07 DIAGNOSIS — F250 Schizoaffective disorder, bipolar type: Secondary | ICD-10-CM | POA: Diagnosis not present

## 2016-11-10 DIAGNOSIS — F250 Schizoaffective disorder, bipolar type: Secondary | ICD-10-CM | POA: Diagnosis not present

## 2016-11-14 DIAGNOSIS — F250 Schizoaffective disorder, bipolar type: Secondary | ICD-10-CM | POA: Diagnosis not present

## 2016-11-16 DIAGNOSIS — F250 Schizoaffective disorder, bipolar type: Secondary | ICD-10-CM | POA: Diagnosis not present

## 2016-11-20 DIAGNOSIS — F250 Schizoaffective disorder, bipolar type: Secondary | ICD-10-CM | POA: Diagnosis not present

## 2016-11-22 DIAGNOSIS — F250 Schizoaffective disorder, bipolar type: Secondary | ICD-10-CM | POA: Diagnosis not present

## 2016-11-23 DIAGNOSIS — D500 Iron deficiency anemia secondary to blood loss (chronic): Secondary | ICD-10-CM | POA: Diagnosis not present

## 2016-11-23 DIAGNOSIS — F419 Anxiety disorder, unspecified: Secondary | ICD-10-CM | POA: Diagnosis not present

## 2016-11-23 DIAGNOSIS — E559 Vitamin D deficiency, unspecified: Secondary | ICD-10-CM | POA: Diagnosis not present

## 2016-11-23 DIAGNOSIS — M545 Low back pain: Secondary | ICD-10-CM | POA: Diagnosis not present

## 2016-11-23 DIAGNOSIS — Z720 Tobacco use: Secondary | ICD-10-CM | POA: Diagnosis not present

## 2016-11-23 DIAGNOSIS — Z9189 Other specified personal risk factors, not elsewhere classified: Secondary | ICD-10-CM | POA: Diagnosis not present

## 2016-11-23 DIAGNOSIS — R7303 Prediabetes: Secondary | ICD-10-CM | POA: Diagnosis not present

## 2016-11-24 DIAGNOSIS — F250 Schizoaffective disorder, bipolar type: Secondary | ICD-10-CM | POA: Diagnosis not present

## 2016-11-27 DIAGNOSIS — F250 Schizoaffective disorder, bipolar type: Secondary | ICD-10-CM | POA: Diagnosis not present

## 2016-11-30 DIAGNOSIS — F250 Schizoaffective disorder, bipolar type: Secondary | ICD-10-CM | POA: Diagnosis not present

## 2016-12-04 DIAGNOSIS — F250 Schizoaffective disorder, bipolar type: Secondary | ICD-10-CM | POA: Diagnosis not present

## 2016-12-06 DIAGNOSIS — F250 Schizoaffective disorder, bipolar type: Secondary | ICD-10-CM | POA: Diagnosis not present

## 2016-12-07 DIAGNOSIS — F259 Schizoaffective disorder, unspecified: Secondary | ICD-10-CM | POA: Diagnosis not present

## 2016-12-07 DIAGNOSIS — F3162 Bipolar disorder, current episode mixed, moderate: Secondary | ICD-10-CM | POA: Diagnosis not present

## 2016-12-07 DIAGNOSIS — F411 Generalized anxiety disorder: Secondary | ICD-10-CM | POA: Diagnosis not present

## 2016-12-11 DIAGNOSIS — F250 Schizoaffective disorder, bipolar type: Secondary | ICD-10-CM | POA: Diagnosis not present

## 2016-12-13 DIAGNOSIS — F250 Schizoaffective disorder, bipolar type: Secondary | ICD-10-CM | POA: Diagnosis not present

## 2016-12-18 DIAGNOSIS — F250 Schizoaffective disorder, bipolar type: Secondary | ICD-10-CM | POA: Diagnosis not present

## 2016-12-20 DIAGNOSIS — N920 Excessive and frequent menstruation with regular cycle: Secondary | ICD-10-CM | POA: Diagnosis not present

## 2016-12-20 DIAGNOSIS — F250 Schizoaffective disorder, bipolar type: Secondary | ICD-10-CM | POA: Diagnosis not present

## 2016-12-26 DIAGNOSIS — F250 Schizoaffective disorder, bipolar type: Secondary | ICD-10-CM | POA: Diagnosis not present

## 2016-12-28 DIAGNOSIS — F250 Schizoaffective disorder, bipolar type: Secondary | ICD-10-CM | POA: Diagnosis not present

## 2017-01-02 DIAGNOSIS — F250 Schizoaffective disorder, bipolar type: Secondary | ICD-10-CM | POA: Diagnosis not present

## 2017-01-10 DIAGNOSIS — F250 Schizoaffective disorder, bipolar type: Secondary | ICD-10-CM | POA: Diagnosis not present

## 2017-01-12 DIAGNOSIS — F3162 Bipolar disorder, current episode mixed, moderate: Secondary | ICD-10-CM | POA: Diagnosis not present

## 2017-01-12 DIAGNOSIS — F411 Generalized anxiety disorder: Secondary | ICD-10-CM | POA: Diagnosis not present

## 2017-01-12 DIAGNOSIS — F259 Schizoaffective disorder, unspecified: Secondary | ICD-10-CM | POA: Diagnosis not present

## 2017-01-17 DIAGNOSIS — F250 Schizoaffective disorder, bipolar type: Secondary | ICD-10-CM | POA: Diagnosis not present

## 2017-01-25 DIAGNOSIS — F250 Schizoaffective disorder, bipolar type: Secondary | ICD-10-CM | POA: Diagnosis not present

## 2017-01-31 DIAGNOSIS — F250 Schizoaffective disorder, bipolar type: Secondary | ICD-10-CM | POA: Diagnosis not present

## 2017-02-08 DIAGNOSIS — F250 Schizoaffective disorder, bipolar type: Secondary | ICD-10-CM | POA: Diagnosis not present

## 2017-02-09 DIAGNOSIS — F411 Generalized anxiety disorder: Secondary | ICD-10-CM | POA: Diagnosis not present

## 2017-02-09 DIAGNOSIS — F3162 Bipolar disorder, current episode mixed, moderate: Secondary | ICD-10-CM | POA: Diagnosis not present

## 2017-02-09 DIAGNOSIS — F259 Schizoaffective disorder, unspecified: Secondary | ICD-10-CM | POA: Diagnosis not present

## 2017-02-12 DIAGNOSIS — F250 Schizoaffective disorder, bipolar type: Secondary | ICD-10-CM | POA: Diagnosis not present

## 2017-03-02 DIAGNOSIS — F250 Schizoaffective disorder, bipolar type: Secondary | ICD-10-CM | POA: Diagnosis not present

## 2017-03-09 DIAGNOSIS — F250 Schizoaffective disorder, bipolar type: Secondary | ICD-10-CM | POA: Diagnosis not present

## 2017-03-16 DIAGNOSIS — F250 Schizoaffective disorder, bipolar type: Secondary | ICD-10-CM | POA: Diagnosis not present

## 2017-03-19 DIAGNOSIS — F250 Schizoaffective disorder, bipolar type: Secondary | ICD-10-CM | POA: Diagnosis not present

## 2017-03-20 DIAGNOSIS — F259 Schizoaffective disorder, unspecified: Secondary | ICD-10-CM | POA: Diagnosis not present

## 2017-03-20 DIAGNOSIS — F411 Generalized anxiety disorder: Secondary | ICD-10-CM | POA: Diagnosis not present

## 2017-03-20 DIAGNOSIS — F3162 Bipolar disorder, current episode mixed, moderate: Secondary | ICD-10-CM | POA: Diagnosis not present

## 2017-03-26 DIAGNOSIS — F250 Schizoaffective disorder, bipolar type: Secondary | ICD-10-CM | POA: Diagnosis not present

## 2017-04-02 DIAGNOSIS — F250 Schizoaffective disorder, bipolar type: Secondary | ICD-10-CM | POA: Diagnosis not present

## 2017-04-13 DIAGNOSIS — F250 Schizoaffective disorder, bipolar type: Secondary | ICD-10-CM | POA: Diagnosis not present

## 2017-04-20 DIAGNOSIS — F250 Schizoaffective disorder, bipolar type: Secondary | ICD-10-CM | POA: Diagnosis not present

## 2017-04-23 DIAGNOSIS — F250 Schizoaffective disorder, bipolar type: Secondary | ICD-10-CM | POA: Diagnosis not present

## 2017-04-30 DIAGNOSIS — F250 Schizoaffective disorder, bipolar type: Secondary | ICD-10-CM | POA: Diagnosis not present

## 2017-05-07 DIAGNOSIS — F250 Schizoaffective disorder, bipolar type: Secondary | ICD-10-CM | POA: Diagnosis not present

## 2017-05-14 DIAGNOSIS — F250 Schizoaffective disorder, bipolar type: Secondary | ICD-10-CM | POA: Diagnosis not present

## 2017-05-25 DIAGNOSIS — F411 Generalized anxiety disorder: Secondary | ICD-10-CM | POA: Diagnosis not present

## 2017-05-25 DIAGNOSIS — F3162 Bipolar disorder, current episode mixed, moderate: Secondary | ICD-10-CM | POA: Diagnosis not present

## 2017-05-25 DIAGNOSIS — F250 Schizoaffective disorder, bipolar type: Secondary | ICD-10-CM | POA: Diagnosis not present

## 2017-05-25 DIAGNOSIS — F259 Schizoaffective disorder, unspecified: Secondary | ICD-10-CM | POA: Diagnosis not present

## 2017-05-28 DIAGNOSIS — F250 Schizoaffective disorder, bipolar type: Secondary | ICD-10-CM | POA: Diagnosis not present

## 2017-06-04 DIAGNOSIS — F250 Schizoaffective disorder, bipolar type: Secondary | ICD-10-CM | POA: Diagnosis not present

## 2017-07-04 DIAGNOSIS — F250 Schizoaffective disorder, bipolar type: Secondary | ICD-10-CM | POA: Diagnosis not present

## 2017-07-10 DIAGNOSIS — F250 Schizoaffective disorder, bipolar type: Secondary | ICD-10-CM | POA: Diagnosis not present

## 2017-07-24 DIAGNOSIS — F250 Schizoaffective disorder, bipolar type: Secondary | ICD-10-CM | POA: Diagnosis not present

## 2017-07-26 DIAGNOSIS — N920 Excessive and frequent menstruation with regular cycle: Secondary | ICD-10-CM | POA: Diagnosis not present

## 2017-08-14 DIAGNOSIS — F250 Schizoaffective disorder, bipolar type: Secondary | ICD-10-CM | POA: Diagnosis not present

## 2017-08-21 DIAGNOSIS — F250 Schizoaffective disorder, bipolar type: Secondary | ICD-10-CM | POA: Diagnosis not present

## 2017-09-04 DIAGNOSIS — F250 Schizoaffective disorder, bipolar type: Secondary | ICD-10-CM | POA: Diagnosis not present

## 2017-09-19 DIAGNOSIS — F250 Schizoaffective disorder, bipolar type: Secondary | ICD-10-CM | POA: Diagnosis not present

## 2017-09-21 DIAGNOSIS — F250 Schizoaffective disorder, bipolar type: Secondary | ICD-10-CM | POA: Diagnosis not present

## 2017-09-25 DIAGNOSIS — F250 Schizoaffective disorder, bipolar type: Secondary | ICD-10-CM | POA: Diagnosis not present

## 2017-10-04 DIAGNOSIS — F250 Schizoaffective disorder, bipolar type: Secondary | ICD-10-CM | POA: Diagnosis not present

## 2017-10-05 DIAGNOSIS — F259 Schizoaffective disorder, unspecified: Secondary | ICD-10-CM | POA: Diagnosis not present

## 2017-10-05 DIAGNOSIS — F411 Generalized anxiety disorder: Secondary | ICD-10-CM | POA: Diagnosis not present

## 2017-10-05 DIAGNOSIS — F3162 Bipolar disorder, current episode mixed, moderate: Secondary | ICD-10-CM | POA: Diagnosis not present

## 2017-10-09 DIAGNOSIS — F250 Schizoaffective disorder, bipolar type: Secondary | ICD-10-CM | POA: Diagnosis not present

## 2017-10-17 DIAGNOSIS — F250 Schizoaffective disorder, bipolar type: Secondary | ICD-10-CM | POA: Diagnosis not present

## 2017-11-06 DIAGNOSIS — F250 Schizoaffective disorder, bipolar type: Secondary | ICD-10-CM | POA: Diagnosis not present

## 2017-11-13 DIAGNOSIS — F250 Schizoaffective disorder, bipolar type: Secondary | ICD-10-CM | POA: Diagnosis not present

## 2017-11-23 DIAGNOSIS — F250 Schizoaffective disorder, bipolar type: Secondary | ICD-10-CM | POA: Diagnosis not present

## 2017-12-25 DIAGNOSIS — F250 Schizoaffective disorder, bipolar type: Secondary | ICD-10-CM | POA: Diagnosis not present

## 2017-12-26 DIAGNOSIS — Z309 Encounter for contraceptive management, unspecified: Secondary | ICD-10-CM | POA: Diagnosis not present

## 2017-12-26 DIAGNOSIS — Z3200 Encounter for pregnancy test, result unknown: Secondary | ICD-10-CM | POA: Diagnosis not present

## 2018-01-18 DIAGNOSIS — F250 Schizoaffective disorder, bipolar type: Secondary | ICD-10-CM | POA: Diagnosis not present

## 2018-01-30 DIAGNOSIS — F250 Schizoaffective disorder, bipolar type: Secondary | ICD-10-CM | POA: Diagnosis not present

## 2018-02-15 DIAGNOSIS — F250 Schizoaffective disorder, bipolar type: Secondary | ICD-10-CM | POA: Diagnosis not present

## 2018-03-01 DIAGNOSIS — F250 Schizoaffective disorder, bipolar type: Secondary | ICD-10-CM | POA: Diagnosis not present

## 2018-03-08 DIAGNOSIS — F250 Schizoaffective disorder, bipolar type: Secondary | ICD-10-CM | POA: Diagnosis not present

## 2018-03-16 DIAGNOSIS — F250 Schizoaffective disorder, bipolar type: Secondary | ICD-10-CM | POA: Diagnosis not present

## 2018-03-21 ENCOUNTER — Emergency Department (HOSPITAL_COMMUNITY)
Admission: EM | Admit: 2018-03-21 | Discharge: 2018-03-21 | Disposition: A | Discharge status: Home / Self Care | Payer: Medicare Other | Attending: Emergency Medicine | Admitting: Emergency Medicine

## 2018-03-21 ENCOUNTER — Encounter (HOSPITAL_COMMUNITY): Payer: Self-pay | Admitting: *Deleted

## 2018-03-21 DIAGNOSIS — Z202 Contact with and (suspected) exposure to infections with a predominantly sexual mode of transmission: Secondary | ICD-10-CM | POA: Diagnosis not present

## 2018-03-21 DIAGNOSIS — Z5321 Procedure and treatment not carried out due to patient leaving prior to being seen by health care provider: Secondary | ICD-10-CM | POA: Diagnosis not present

## 2018-03-21 LAB — RAPID HIV SCREEN (HIV 1/2 AB+AG)
HIV 1/2 Antibodies: REACTIVE — AB
HIV-1 P24 Antigen - HIV24: REACTIVE — AB

## 2018-03-21 LAB — COMPREHENSIVE METABOLIC PANEL
ALT: 30 U/L (ref 14–54)
AST: 23 U/L (ref 15–41)
Albumin: 3.5 g/dL (ref 3.5–5.0)
Alkaline Phosphatase: 59 U/L (ref 38–126)
Anion gap: 5 (ref 5–15)
BILIRUBIN TOTAL: 0.6 mg/dL (ref 0.3–1.2)
BUN: 7 mg/dL (ref 6–20)
CO2: 27 mmol/L (ref 22–32)
Calcium: 9 mg/dL (ref 8.9–10.3)
Chloride: 106 mmol/L (ref 101–111)
Creatinine, Ser: 0.59 mg/dL (ref 0.44–1.00)
GFR calc Af Amer: >60 mL/min (ref >60)
Glucose, Bld: 100 mg/dL — ABNORMAL HIGH (ref 65–99)
Potassium: 3.6 mmol/L (ref 3.5–5.1)
Sodium: 138 mmol/L (ref 135–145)
TOTAL PROTEIN: 6.6 g/dL (ref 6.5–8.1)

## 2018-03-21 LAB — POC URINE PREG, ED: PREG TEST UR: NEGATIVE

## 2018-03-21 MED ORDER — ELVITEG-COBIC-EMTRICIT-TENOFAF 150-150-200-10 MG PO TABS: 1.0000 | ORAL_TABLET | Freq: Every day | ORAL | 0 refills | Status: DC

## 2018-03-21 NOTE — ED Triage Notes (Signed)
Pt in stating she received a message from someone she had sex with last night that he tested positive for HIV, and she is here to be tested, denies any complaints

## 2018-03-21 NOTE — ED Notes (Signed)
Called pt 3x to go back to room. No response.  ?

## 2018-03-21 NOTE — ED Notes (Signed)
No answer for treatment room. Presumed to have left without being seen. 

## 2018-03-21 NOTE — ED Notes (Signed)
Called pt to come back to room. No response.  

## 2018-03-21 NOTE — ED Provider Notes (Signed)
32 year old female was seen previously in the emergency room for STD testing as she was told that 1 of her partners was positive for HIV.  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  I spoke with the patient on the phone, she verified her birthday and that she was here in the emergency room.  I explained to her that although her test was positive here sometimes there are false positives and that her labs would be sent off for definitive testing.  I expressed that she would need to follow-up with the infectious disease clinic, I gave her the contact information she assured me that she would follow-up as an outpatient for ongoing evaluation and management.  Patient verbalized understanding that this would have to be evaluated and treated as this is potentially life-threatening.   Eyvonne Mechanic, PA-C 03/21/18 [id number]    Pricilla Loveless, MD 03/27/18 0100

## 2018-03-22 ENCOUNTER — Telehealth: Payer: Self-pay | Admitting: Infectious Disease

## 2018-03-22 DIAGNOSIS — F250 Schizoaffective disorder, bipolar type: Secondary | ICD-10-CM | POA: Diagnosis not present

## 2018-03-22 LAB — RPR: RPR Ser Ql: NONREACTIVE

## 2018-03-22 NOTE — Telephone Encounter (Signed)
Excellent. I dont think she will qualify for study but I would recommend giving her a bottle of SYMTUZA to start and she can take her first pill int he clinic

## 2018-03-22 NOTE — Telephone Encounter (Signed)
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 Pt does not have a formal HIV 4th gen back. Also in the assay it reports ag and ab as +  Aram BeechamCynthia we could not put on study this week. [facility].  I think best thing would be if someone could see her ASAP and rapid start her. There are 3 full bottles of SYMTUZA in clinic and we could give her one of those even today

## 2018-03-22 NOTE — Telephone Encounter (Signed)
RN received patient's test results, called to offer her an appointment [facility] at 9:45 with Dr Orvan Falconerampbell.  Sabrina Galloway accepted the appointment, asked for a straight answer to her results, if they were confirmed. RN advised that the test from the emergency room did come back positive for HIV antibody and antigen, that we would be doing additional bloodwork for confirmation.  RN encouraged patient that [facility] whether she her test results were positive or negative (PrEP), scheduled her with pharmacy and counseling after seeing Dr Orvan Falconerampbell. Patient stated she felt better with a plan, will come Tuesday. Andree CossHowell, Mylani Gentry M, RN

## 2018-03-23 LAB — HEPATITIS B SURFACE ANTIGEN: Hepatitis B Surface Ag: NEGATIVE

## 2018-03-23 LAB — HEPATITIS C ANTIBODY

## 2018-03-26 ENCOUNTER — Ambulatory Visit: Payer: Medicare Other | Admitting: Licensed Clinical Social Worker

## 2018-03-26 ENCOUNTER — Ambulatory Visit: Payer: Medicare Other | Admitting: Internal Medicine

## 2018-03-26 ENCOUNTER — Ambulatory Visit: Payer: Medicare Other

## 2018-03-26 DIAGNOSIS — Z21 Asymptomatic human immunodeficiency virus [HIV] infection status: Secondary | ICD-10-CM | POA: Insufficient documentation

## 2018-03-27 LAB — HIV-1/2 AB - DIFFERENTIATION
HIV 1 AB: NEGATIVE
HIV 2 Ab: NEGATIVE
Note: NEGATIVE

## 2018-03-27 LAB — RNA QUALITATIVE: HIV 1 RNA Qualitative: 1

## 2018-03-28 ENCOUNTER — Emergency Department (HOSPITAL_COMMUNITY)
Admission: EM | Admit: 2018-03-28 | Discharge: 2018-03-29 | Disposition: A | Discharge status: Home / Self Care | Payer: Medicare Other | Attending: Emergency Medicine | Admitting: Emergency Medicine

## 2018-03-28 ENCOUNTER — Telehealth: Payer: Self-pay | Admitting: *Deleted

## 2018-03-28 ENCOUNTER — Encounter (HOSPITAL_COMMUNITY): Payer: Self-pay | Admitting: Emergency Medicine

## 2018-03-28 ENCOUNTER — Other Ambulatory Visit: Payer: Self-pay

## 2018-03-28 DIAGNOSIS — F259 Schizoaffective disorder, unspecified: Secondary | ICD-10-CM | POA: Insufficient documentation

## 2018-03-28 DIAGNOSIS — Z79899 Other long term (current) drug therapy: Secondary | ICD-10-CM | POA: Insufficient documentation

## 2018-03-28 DIAGNOSIS — F250 Schizoaffective disorder, bipolar type: Secondary | ICD-10-CM | POA: Diagnosis not present

## 2018-03-28 DIAGNOSIS — F309 Manic episode, unspecified: Secondary | ICD-10-CM | POA: Insufficient documentation

## 2018-03-28 DIAGNOSIS — B20 Human immunodeficiency virus [HIV] disease: Secondary | ICD-10-CM | POA: Diagnosis not present

## 2018-03-28 DIAGNOSIS — G4709 Other insomnia: Secondary | ICD-10-CM | POA: Diagnosis not present

## 2018-03-28 DIAGNOSIS — F1220 Cannabis dependence, uncomplicated: Secondary | ICD-10-CM | POA: Diagnosis not present

## 2018-03-28 HISTORY — DX: Bipolar disorder, unspecified: F31.9

## 2018-03-28 HISTORY — DX: Schizoaffective disorder, bipolar type: F25.0

## 2018-03-28 HISTORY — DX: Anxiety disorder, unspecified: F41.9

## 2018-03-28 HISTORY — DX: Schizoaffective disorder, unspecified: F25.9

## 2018-03-28 MED ORDER — HYDROXYZINE HCL 25 MG PO TABS
25.0000 mg | ORAL_TABLET | Freq: Three times a day (TID) | ORAL | Status: DC | PRN
  Administered 2018-03-28: 25 mg via ORAL
  Filled 2018-03-28: qty 1

## 2018-03-28 MED ORDER — DOXEPIN HCL 25 MG PO CAPS
25.0000 mg | ORAL_CAPSULE | Freq: Once | ORAL | Status: AC
  Administered 2018-03-28: 25 mg via ORAL
  Filled 2018-03-28: qty 1

## 2018-03-28 NOTE — BH Assessment (Signed)
Tele Assessment Note   Patient Name: Sabrina DanesMelody S Surges MRN: [id number] Referring Physician: Melene PlanFloyd, Dan, DO Location of [facility]: [facility] Location of Provider: Behavioral Health TTS Department  Miku Tilman NeatS Cuoco is an 32 y.o. female present to the [facility] accompanied by a family friend / care taker Lorre Munroe(Tracy Stevens) with complaint of acute mania. Patient has a mental health history of Schizoaffective disorder, Bipolar type. Patient report she has not been on her medication since January 2019. Patient has been engaging in risky activities such as hyper sexual behaviors with strangers. Per family friend who patient lives with patient gets missing for days. She recently left the home on Sunday and did not return until today. Additional at risk behaviors include substance use and lying. Patient admits to drinking alcohol and using THC. Patient UDS's was not available during time of assessment. Patient receives outpatient therapy services every Friday with therapist Leavy CellaJasmine [phone]) [facility]). Patient does not receive medication management services. Per EDP note [facility] Patient report she used to attend [facility], due to missing or counseling appointments she was discharged as a patient.   Patient pleasant and calm. Patient denies violent behaviors. Denies suicidal / homicidal ideations and denies auditory / visual hallucinations. Report insomnia with only 2 or 3 hours of sleep. Normal appetite. Denies criminal chargers or any upcoming court dates.    Diagnosis:  F25.0   Schizoaffective disorder, Bipolar type  Past Medical History:  Past Medical History:  Diagnosis Date  . Anxiety   . Bipolar 1 disorder (HCC)   . Obesity   . Schizo affective schizophrenia (HCC)     History reviewed. No pertinent surgical history.  Family History: History reviewed. No pertinent family history.  Social History:  reports that she has never  smoked. She has never used smokeless tobacco. She reports that she drinks alcohol. She reports that she has current or past drug history. Drug: Marijuana.  Additional Social History:  Alcohol / Drug Use Pain Medications: see MAR Prescriptions: see MAR Over the Counter: see MAR History of alcohol / drug use?: Yes Substance #1 Name of Substance 1: THC  1 - Age of First Use: 15 1 - Amount (size/oz): unknown 1 - Frequency: daily 1 - Duration: ongoing 1 - Last Use / Amount: 03/27/2018 Substance #2 Name of Substance 2: alcohol  2 - Age of First Use: 11 2 - Amount (size/oz): unknown 2 - Frequency: occasionally  2 - Duration: ongoing  2 - Last Use / Amount: last week   CIWA: CIWA-Ar BP: 106/65 Pulse Rate: 80 COWS:    Allergies:  Allergies  Allergen Reactions  . Vicodin [Hydrocodone-Acetaminophen] Hives and Nausea And Vomiting    Patient can tolerate acetaminophen    Home Medications:  (Not in a hospital admission)  OB/GYN Status:  Patient's last menstrual period was 02/26/2018.  General Assessment Data Location of Assessment: [facility] TTS Assessment: In system Is this a Tele or Face-to-Face Assessment?: Face-to-Face Is this an Initial Assessment or a Re-assessment for this encounter?: Initial Assessment Marital status: Single Is patient pregnant?: No Pregnancy Status: No Living Arrangements: Other relatives(with family friend who takes care of her) Can pt return to current living arrangement?: Yes Admission Status: Voluntary Is patient capable of signing voluntary admission?: Yes Referral Source: Self/Family/Friend Insurance type: Medicare     Crisis Care Plan Living Arrangements: Other relatives(with family friend who takes care of her) Legal Guardian: Other:(self) Name of Psychiatrist: (used  to see [facility]) Name of Therapist: [facility] (Therapist - Leavy Cella)  Education Status Is patient currently in school?: No Is the patient  employed, unemployed or receiving disability?: Receiving disability income  Risk to self with the past 6 months Suicidal Ideation: No Has patient been a risk to self within the past 6 months prior to admission? : No Suicidal Intent: No Has patient had any suicidal intent within the past 6 months prior to admission? : No Is patient at risk for suicide?: No Suicidal Plan?: No Has patient had any suicidal plan within the past 6 months prior to admission? : No Access to Means: No What has been your use of drugs/alcohol within the last 12 months?: THC and alcohol  Previous Attempts/Gestures: No How many times?: 0 Other Self Harm Risks: none report Triggers for Past Attempts: None known Intentional Self Injurious Behavior: None Family Suicide History: No Recent stressful life event(s): Other (Comment)(no on medication ) Persecutory voices/beliefs?: No Depression: No Substance abuse history and/or treatment for substance abuse?: Yes Suicide prevention information given to non-admitted patients: Not applicable  Risk to Others within the past 6 months Homicidal Ideation: No Does patient have any lifetime risk of violence toward others beyond the six months prior to admission? : No Thoughts of Harm to Others: No Current Homicidal Intent: No Current Homicidal Plan: No Access to Homicidal Means: No Identified Victim: n/a History of harm to others?: No Assessment of Violence: None Noted Violent Behavior Description: none noted Does patient have access to weapons?: No Criminal Charges Pending?: No Does patient have a court date: No Is patient on probation?: No  Psychosis Hallucinations: None noted Delusions: None noted  Mental Status Report Appearance/Hygiene: In scrubs, Disheveled Eye Contact: Fair Motor Activity: Freedom of movement Speech: Logical/coherent Level of Consciousness: Alert Mood: Pleasant Affect: Appropriate to circumstance Anxiety Level: None Thought Processes:  Coherent, Relevant Judgement: Partial Orientation: Person, Place, Time, Situation Obsessive Compulsive Thoughts/Behaviors: None  Cognitive Functioning Concentration: Normal Memory: Recent Intact, Remote Intact Is patient IDD: No Is patient DD?: No Insight: Fair Impulse Control: Poor Appetite: Fair Have you had any weight changes? : No Change Sleep: Decreased Total Hours of Sleep: 2 Vegetative Symptoms: None  ADLScreening [facility] Assessment Services) Patient's cognitive ability adequate to safely complete daily activities?: Yes Patient able to express need for assistance with ADLs?: Yes Independently performs ADLs?: Yes (appropriate for developmental age)  Prior Inpatient Therapy Prior Inpatient Therapy: No  Prior Outpatient Therapy Prior Outpatient Therapy: Yes Prior Therapy Dates: every Friday Prior Therapy Facilty/Provider(s): [facility] Reason for Treatment: [facility]  Does patient have an ACCT team?: No Does patient have Intensive In-House Services?  : No Does patient have [facility] services? : No Does patient have P4[facility] services?: No  ADL Screening (condition at time of admission) Patient's cognitive ability adequate to safely complete daily activities?: Yes Is the patient deaf or have difficulty hearing?: No Does the patient have difficulty seeing, even when wearing glasses/contacts?: No Does the patient have difficulty concentrating, remembering, or making decisions?: No Patient able to express need for assistance with ADLs?: Yes Does the patient have difficulty dressing or bathing?: No Independently performs ADLs?: Yes (appropriate for developmental age) Does the patient have difficulty walking or climbing stairs?: No       Abuse/Neglect Assessment (Assessment to be complete while patient is alone) Abuse/Neglect Assessment Can Be Completed: Yes Physical Abuse: Denies Verbal Abuse: Denies Sexual Abuse: Denies Exploitation of patient/patient's  resources: Denies Self-Neglect:  Denies     Advance Directives (For Healthcare) Does Patient Have a Medical Advance Directive?: No Would patient like information on creating a medical advance directive?: No - Patient declined          Disposition:  Disposition Initial Assessment Completed for this Encounter: Yes(Jamison Lord, NP, recommend inpt tx)  This service was provided via telemedicine using a 2-way, interactive audio and video technology.  Names of all persons participating in this telemedicine service and their role in this encounter. Name: Lorre Munroe Role: family friend / care taker   Dian Situ 03/28/2018 5:35 PM

## 2018-03-28 NOTE — ED Provider Notes (Signed)
[facility]-EMERGENCY DEPT Provider Note   CSN: [id number] Arrival date & time: 03/28/18  1550     History   Chief Complaint Chief Complaint  Patient presents with  . medication adjustment    HPI Sabrina Galloway is a 32 y.o. female.  32 yo F with a chief complaint of acute mania.  Per the family she has not been going to her appointments and was thrown out of the clinic at [facility].  She is to be on in [place] and then was switched to Abilify she does not like the way these make her feel and they do not feel that they have helped her for her schizophrenia.  Family is concerned over the past week or so she has been making risky decisions and doing illegal drugs.  They are concerned for her safety and feel that she has not been sleeping or eating and is a danger to herself.  The patient denies suicidal or homicidal ideation.  The history is provided by the patient.  Illness  This is a new problem. The current episode started more than 1 week ago. The problem occurs constantly. The problem has not changed since onset.Pertinent negatives include no chest pain, no headaches and no shortness of breath. Nothing aggravates the symptoms. Nothing relieves the symptoms. She has tried nothing for the symptoms. The treatment provided no relief.    Past Medical History:  Diagnosis Date  . Anxiety   . Bipolar 1 disorder (HCC)   . Obesity   . Schizo affective schizophrenia [facility])     Patient Active Problem List   Diagnosis Date Noted  . 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 No pertinent surgical history.   OB History   None      Home Medications    Prior to Admission medications   Medication Sig Start Date End Date Taking? Authorizing Provider  ARIPiprazole (ABILIFY IM) Inject into the muscle.   Yes [provider]  benzonatate (TESSALON) 100 MG capsule Take 2 capsules (200 mg total) by mouth 2 (two) times daily as needed for cough.  05/14/15   Muthersbaugh, Dahlia Client, PA-C  elvitegravir-cobicistat-emtricitabine-tenofovir (GENVOYA) 150-150-200-10 MG TABS tablet Take 1 tablet by mouth daily with breakfast. 03/21/18   Caccavale, Sophia, PA-C  elvitegravir-cobicistat-emtricitabine-tenofovir (GENVOYA) 150-150-200-10 MG TABS tablet Take 1 tablet by mouth daily with breakfast. 03/21/18   Caccavale, Sophia, PA-C  fluticasone (FLONASE) 50 MCG/ACT nasal spray Place 2 sprays into both nostrils daily. 05/14/15   Muthersbaugh, Dahlia Client, PA-C  guaiFENesin (MUCINEX) 600 MG 12 hr tablet Take 2 tablets (1,200 mg total) by mouth 2 (two) times daily. Patient not taking: Reported on 03/28/2018 05/14/15   Muthersbaugh, Dahlia Client, PA-C  ibuprofen (ADVIL,MOTRIN) 200 MG tablet Take 400 mg by mouth every 6 (six) hours as needed for mild pain.     [provider]    Family History History reviewed. No pertinent family history.  Social History Social History   Tobacco Use  . Smoking status: Never Smoker  . Smokeless tobacco: Never Used  Substance Use Topics  . Alcohol use: Yes  . Drug use: Yes    Types: Marijuana     Allergies   Vicodin [hydrocodone-acetaminophen]   Review of Systems Review of Systems  Constitutional: Negative for chills and fever.  HENT: Negative for congestion and rhinorrhea.   Eyes: Negative for redness and visual disturbance.  Respiratory: Negative for shortness of breath and wheezing.   Cardiovascular: Negative for chest pain and  palpitations.  Gastrointestinal: Negative for nausea and vomiting.  Genitourinary: Negative for dysuria and urgency.  Musculoskeletal: Negative for arthralgias and myalgias.  Skin: Negative for pallor and wound.  Neurological: Negative for dizziness and headaches.  Psychiatric/Behavioral: Positive for behavioral problems. The patient is nervous/anxious and is hyperactive.      Physical Exam Updated Vital Signs BP 106/65 (BP Location: Right Arm)   Pulse 80   Temp 98.3 F (36.8 C)  (Oral)   Resp 20   Ht 5' (1.524 m)   Wt 90.7 kg (200 lb)   LMP 02/26/2018   SpO2 98%   BMI 39.06 kg/m   Physical Exam  Constitutional: She is oriented to person, place, and time. She appears well-developed and well-nourished. No distress.  HENT:  Head: Normocephalic and atraumatic.  Eyes: Pupils are equal, round, and reactive to light. EOM are normal.  Neck: Normal range of motion. Neck supple.  Cardiovascular: Normal rate and regular rhythm. Exam reveals no gallop and no friction rub.  No murmur heard. Pulmonary/Chest: Effort normal. She has no wheezes. She has no rales.  Abdominal: Soft. She exhibits no distension. There is no tenderness.  Musculoskeletal: She exhibits no edema or tenderness.  Neurological: She is alert and oriented to person, place, and time.  Skin: Skin is warm and dry. She is not diaphoretic.  Psychiatric: She has a normal mood and affect. Her behavior is normal.  Nursing note and vitals reviewed.    ED Treatments / Results  Labs (all labs ordered are listed, but only abnormal results are displayed) Labs Reviewed  POC URINE PREG, ED    EKG None  Radiology No results found.  Procedures Procedures (including critical care time)  Medications Ordered in ED Medications  hydrOXYzine (ATARAX/VISTARIL) tablet 25 mg (25 mg Oral Given 03/28/18 2128)  doxepin (SINEQUAN) capsule 25 mg (25 mg Oral Given 03/28/18 2128)     Initial Impression / Assessment and Plan / ED Course  I have reviewed the triage vital signs and the nursing notes.  Pertinent labs & imaging results that were available during my care of the patient were reviewed by me and considered in my medical decision making (see chart for details).     32 yo F with a chief complaint of acute mania.  Going on for the past few days to the week.  Family states she has not been taking her medications and she has been thrown out of her psychiatry clinic.  Family is having trouble getting her to go to  her appointments and she has not been taking her meds.  They are here to have her evaluated for possible inpatient psychiatric placement and stabilization on her medication regimen.  I feel that she is medically clear.  TTS evaluation.  Reeval in AM.   The patients results and plan were reviewed and discussed.   Any x-rays performed were independently reviewed by myself.   Differential diagnosis were considered with the presenting HPI.  Medications  hydrOXYzine (ATARAX/VISTARIL) tablet 25 mg (25 mg Oral Given 03/28/18 2128)  doxepin (SINEQUAN) capsule 25 mg (25 mg Oral Given 03/28/18 2128)    Vitals:   03/28/18 1601 03/28/18 1624 03/28/18 1700  BP: 133/79  106/65  Pulse: 80  80  Resp: 20  20  Temp: 98.3 F (36.8 C)  98.3 F (36.8 C)  TempSrc: Oral  Oral  SpO2: 100%  98%  Weight:  90.7 kg (200 lb)   Height:  5' (1.524 m)  Final diagnoses:  Mania [facility])    Admission/ observation were discussed with the admitting physician, patient and/or family and they are comfortable with the plan.    Final Clinical Impressions(s) / ED Diagnoses   Final diagnoses:  Mania [facility])    ED Discharge Orders    None       Melene Plan, DO 03/28/18 2258

## 2018-03-28 NOTE — ED Triage Notes (Signed)
Pt brought in by friend for medication evaluation. Pt has schizophrenia and other mental health problems and hasnt had her injection since Jan. Denies SI or HI.

## 2018-03-28 NOTE — Telephone Encounter (Signed)
Onalee Huaavid, how do I make the referral to you? I'm not sure if her HIV results are 100% clearly positive at this time. Would she still qualify?

## 2018-03-28 NOTE — Telephone Encounter (Signed)
Spoke with patient. She did not have transportation for her appointment with Dr Orvan Falconerampbell on Tuesday. RN asked if I could reschedule her, as her labs still need confirmed. Patient said she was out of town, but would be back Monday or Tuesday.  Appointment made for Tuesday 6/11. Andree CossHowell, Dustin Burrill M, RN

## 2018-03-28 NOTE — ED Notes (Signed)
Pt A&O x 3, no distress noted, calm & cooperative.  Requesting sleep meds at present.  Monitoring for safety, Q 15 min checks in effect.

## 2018-03-28 NOTE — ED Notes (Signed)
Pt has a caregiver her phone number is [phone], her name Kennith Centerracey.

## 2018-03-28 NOTE — Telephone Encounter (Signed)
Sabrina Galloway is looking for people who need help with transportation, so we can offer her that service!

## 2018-03-28 NOTE — ED Notes (Signed)
Pt oriented to room and unit.  Pt is calm and cooperative.  Denies S/I, H/I, and AVH. States she just needs sleep.

## 2018-03-28 NOTE — ED Notes (Signed)
Bed: [facility]4 Expected date:  Expected time:  Means of arrival:  Comments: 

## 2018-03-29 ENCOUNTER — Telehealth: Payer: Self-pay | Admitting: *Deleted

## 2018-03-29 DIAGNOSIS — F1220 Cannabis dependence, uncomplicated: Secondary | ICD-10-CM | POA: Diagnosis not present

## 2018-03-29 DIAGNOSIS — F250 Schizoaffective disorder, bipolar type: Secondary | ICD-10-CM | POA: Diagnosis present

## 2018-03-29 DIAGNOSIS — F309 Manic episode, unspecified: Secondary | ICD-10-CM | POA: Diagnosis not present

## 2018-03-29 DIAGNOSIS — B20 Human immunodeficiency virus [HIV] disease: Secondary | ICD-10-CM | POA: Diagnosis not present

## 2018-03-29 DIAGNOSIS — G4709 Other insomnia: Secondary | ICD-10-CM | POA: Diagnosis not present

## 2018-03-29 LAB — URINALYSIS, ROUTINE W REFLEX MICROSCOPIC
Bilirubin Urine: NEGATIVE
Glucose, UA: NEGATIVE mg/dL
Hgb urine dipstick: NEGATIVE
Ketones, ur: NEGATIVE mg/dL
Leukocytes, UA: NEGATIVE
Nitrite: NEGATIVE
Protein, ur: NEGATIVE mg/dL
Specific Gravity, Urine: 1.02 (ref 1.005–1.030)
pH: 6 (ref 5.0–8.0)

## 2018-03-29 MED ORDER — PALIPERIDONE PALMITATE ER 156 MG/ML IM SUSY
156.0000 mg | PREFILLED_SYRINGE | Freq: Once | INTRAMUSCULAR | Status: DC
  Filled 2018-03-29 (×2): qty 1

## 2018-03-29 MED ORDER — BENZTROPINE MESYLATE 1 MG PO TABS: 1.0000 mg | ORAL_TABLET | Freq: Every day | ORAL | 0 refills | Status: AC

## 2018-03-29 MED ORDER — PALIPERIDONE ER 6 MG PO TB24: 6.0000 mg | ORAL_TABLET | Freq: Every day | ORAL | 0 refills | Status: DC

## 2018-03-29 MED ORDER — ARIPIPRAZOLE 5 MG PO TABS: 5.0000 mg | ORAL_TABLET | Freq: Every day | ORAL | 0 refills | Status: AC

## 2018-03-29 MED ORDER — ARIPIPRAZOLE ER 400 MG IM SRER
400.0000 mg | INTRAMUSCULAR | Status: DC
  Administered 2018-03-29: 400 mg via INTRAMUSCULAR
  Filled 2018-03-29: qty 2

## 2018-03-29 MED ORDER — BENZTROPINE MESYLATE 1 MG PO TABS
1.0000 mg | ORAL_TABLET | Freq: Every day | ORAL | Status: DC
  Administered 2018-03-29: 1 mg via ORAL
  Filled 2018-03-29: qty 1

## 2018-03-29 MED ORDER — ARIPIPRAZOLE ER 400 MG IM SRER: 300.0000 mg | INTRAMUSCULAR | Status: DC

## 2018-03-29 MED ORDER — PALIPERIDONE ER 6 MG PO TB24
6.0000 mg | ORAL_TABLET | Freq: Every day | ORAL | Status: DC
  Filled 2018-03-29: qty 1

## 2018-03-29 MED ORDER — HYDROXYZINE HCL 25 MG PO TABS: 25.0000 mg | ORAL_TABLET | Freq: Three times a day (TID) | ORAL | 0 refills | Status: AC | PRN

## 2018-03-29 MED ORDER — ARIPIPRAZOLE 5 MG PO TABS
5.0000 mg | ORAL_TABLET | Freq: Every day | ORAL | Status: DC
  Administered 2018-03-29: 5 mg via ORAL
  Filled 2018-03-29: qty 1

## 2018-03-29 NOTE — ED Notes (Signed)
Urine collection cup given to pt with request for sample. Pt made no comment.

## 2018-03-29 NOTE — Discharge Instructions (Signed)
Received Abilify injection 300 mg in a single dose.  Will need to continue to take Abilify 5 mg p.o. daily for 21 days.  Please follow-up with outpatient psychiatrist, and initiate ACT services for ongoing management of psychiatric services.

## 2018-03-29 NOTE — Telephone Encounter (Signed)
After speaking with Sabrina Galloway multiple times today, I think it would be best for her to keep the appointment as scheduled with Tammy SoursGreg.

## 2018-03-29 NOTE — Telephone Encounter (Signed)
Patient called with many questions, confirmed her appointment for Tuesday 6/11 at 2:30.  Sabrina Galloway, Vencil Basnett M, RN

## 2018-03-29 NOTE — Consult Note (Addendum)
[facility]   Reason for Consult:Agressive behaviors Referring Physician:  ER physician Patient Identification: Sabrina DanesMelody S Galloway MRN:  [id number] Principal Diagnosis: Schizoaffective disorder, bipolar type [facility](HCC) Diagnosis:   Patient Active Problem List   Diagnosis Date Noted  . HIV antibody positive (HCC) [Z21] 03/26/2018    Total Time spent with patient: 30 minutes  Subjective:   Sabrina Galloway is a 32 y.o. female patient admitted with medication noncompliance, acute mania, and aggressive behaviors.   HPI:   Per chart review, patient was admitted with concern for manic symptoms in the setting of poor medication compliance. She was reported to having increased risky behaviors as well as alcohol and marijuana use. On interview, Sabrina Galloway is patient pleasant and calm. Patient denies violent behaviors. Denies suicidal / homicidal ideations and denies auditory / visual hallucinations. Report insomnia with only 2 or 3 hours of sleep. Normal appetite. Denies criminal chargers or any upcoming court dates.    Collateral obtained from caregiver Lorre Munroeracy Stevens.  She states patient was previously on Abilify injection.  However she tolerated Invega better and will receive Cogentin to help balance side effects which made it more tolerable for her.  She states Invega controls her anger a lot more.  She is also going to reach out and resume ACT services because recently patient was going to [facility] however would not receive injection.  Past Psychiatric History: Schizoaffective disorder, bipolar type.  Past psychiatric medications include Abilify and in Vega, trazodone, Lamictal, and Hydroxyzine.  Was previously receiving medication [facility].  She was also a client at [facility] and [facility].  She denies any suicidal attempt or nonsuicidal injurious behavior.   Risk to Self: Suicidal  Ideation: No Suicidal Intent: No Is patient at risk for suicide?: No Suicidal Plan?: No Access to Means: No What has been your use of drugs/alcohol within the last 12 months?: THC and alcohol  How many times?: 0 Other Self Harm Risks: none report Triggers for Past Attempts: None known Intentional Self Injurious Behavior: None Risk to Others: Homicidal Ideation: No Thoughts of Harm to Others: No Current Homicidal Intent: No Current Homicidal Plan: No Access to Homicidal Means: No Identified Victim: n/a History of harm to others?: No Assessment of Violence: None Noted Violent Behavior Description: none noted Does patient have access to weapons?: No Criminal Charges Pending?: No Does patient have a court date: No Prior Inpatient Therapy: Prior Inpatient Therapy: No Prior Outpatient Therapy: Prior Outpatient Therapy: Yes Prior Therapy Dates: every Friday Prior Therapy Facilty/Provider([facility]): [facility] Reason for Treatment: [facility]  Does patient have an ACCT team?: No Does patient have Intensive In-House Services?  : No Does patient have [facility] services? : No Does patient have P4CC services?: No  Past Medical History:  Past Medical History:  Diagnosis Date  . Anxiety   . Bipolar 1 disorder (HCC)   . Obesity   . Schizo affective schizophrenia (HCC)    History reviewed. No pertinent surgical history. Family History: History reviewed. No pertinent family history. Family Psychiatric  History: Unknown  Social History:  Social History   Substance and Sexual Activity  Alcohol Use Yes     Social History   Substance and Sexual Activity  Drug Use Yes  . Types: Marijuana    Social History   Socioeconomic History  . Marital status: Single    Spouse name: Not on file  . Number of  children: Not on file  . Years of education: Not on file  . Highest education level: Not on file  Occupational History  . Not on file  Social Needs  . Financial resource strain: Not  on file  . Food insecurity:    Worry: Not on file    Inability: Not on file  . Transportation needs:    Medical: Not on file    Non-medical: Not on file  Tobacco Use  . Smoking status: Never Smoker  . Smokeless tobacco: Never Used  Substance and Sexual Activity  . Alcohol use: Yes  . Drug use: Yes    Types: Marijuana  . Sexual activity: Yes    Birth control/protection: None  Lifestyle  . Physical activity:    Days per week: Not on file    Minutes per session: Not on file  . Stress: Not on file  Relationships  . Social connections:    Talks on phone: Not on file    Gets together: Not on file    Attends religious service: Not on file    Active member of club or organization: Not on file    Attends meetings of clubs or organizations: Not on file    Relationship status: Not on file  Other Topics Concern  . Not on file  Social History Narrative  . Not on file   Additional Social History: N/A    Allergies:   Allergies  Allergen Reactions  . Vicodin [Hydrocodone-Acetaminophen] Hives and Nausea And Vomiting    Patient can tolerate acetaminophen    Labs:  Results for orders placed or performed during the hospital encounter of 03/28/18 (from the past 48 hour(s))  Urinalysis, Routine w reflex microscopic     Status: None   Collection Time: 03/29/18 10:43 AM  Result Value Ref Range   Color, Urine YELLOW YELLOW   APPearance CLEAR CLEAR   Specific Gravity, Urine 1.020 1.005 - 1.030   pH 6.0 5.0 - 8.0   Glucose, UA NEGATIVE NEGATIVE mg/dL   Hgb urine dipstick NEGATIVE NEGATIVE   Bilirubin Urine NEGATIVE NEGATIVE   Ketones, ur NEGATIVE NEGATIVE mg/dL   Protein, ur NEGATIVE NEGATIVE mg/dL   Nitrite NEGATIVE NEGATIVE   Leukocytes, UA NEGATIVE NEGATIVE    Comment: Performed at [facility], [address] [place], [place] [address]    Current Facility-Administered Medications  Medication Dose Route Frequency Provider Last Rate Last Dose  . benztropine  (COGENTIN) tablet 1 mg  1 mg Oral Daily Starkes, Juel Burrow, FNP      . hydrOXYzine (ATARAX/VISTARIL) tablet 25 mg  25 mg Oral TID PRN Nira Conn A, NP   25 mg at 03/28/18 2128  . paliperidone (INVEGA SUSTENNA) injection 156 mg  156 mg Intramuscular Once Starkes, Takia S, FNP      . paliperidone (INVEGA) 24 hr tablet 6 mg  6 mg Oral Daily Starkes, Juel Burrow, FNP       Current Outpatient Medications  Medication Sig Dispense Refill  . ARIPiprazole (ABILIFY IM) Inject into the muscle.    . benzonatate (TESSALON) 100 MG capsule Take 2 capsules (200 mg total) by mouth 2 (two) times daily as needed for cough. 20 capsule 0  . elvitegravir-cobicistat-emtricitabine-tenofovir (GENVOYA) 150-150-200-10 MG TABS tablet Take 1 tablet by mouth daily with breakfast. 5 tablet 0  . elvitegravir-cobicistat-emtricitabine-tenofovir (GENVOYA) 150-150-200-10 MG TABS tablet Take 1 tablet by mouth daily with breakfast. 23 tablet 0  . fluticasone (FLONASE) 50 MCG/ACT nasal spray Place 2  sprays into both nostrils daily. 9.9 g 2  . guaiFENesin (MUCINEX) 600 MG 12 hr tablet Take 2 tablets (1,200 mg total) by mouth 2 (two) times daily. (Patient not taking: Reported on 03/28/2018) 20 tablet 0  . ibuprofen (ADVIL,MOTRIN) 200 MG tablet Take 400 mg by mouth every 6 (six) hours as needed for mild pain.       Musculoskeletal: Strength & Muscle Tone: within normal limits Gait & Station: normal Patient leans: N/A  Psychiatric Specialty Exam: See SRA completed by NP Physical Exam  Nursing note and vitals reviewed. Constitutional: She is oriented to person, place, and time. She appears well-developed and well-nourished.  HENT:  Head: Normocephalic and atraumatic.  Neck: Normal range of motion.  Respiratory: Effort normal.  Musculoskeletal: Normal range of motion.  Neurological: She is alert and oriented to person, place, and time.  Skin: No rash noted.  Psychiatric: She has a normal mood and affect. Her speech is normal and  behavior is normal. Judgment and thought content normal. Cognition and memory are normal.    Review of Systems  Psychiatric/Behavioral: Positive for substance abuse. Negative for hallucinations and suicidal ideas. The patient has insomnia.   All other systems reviewed and are negative.   Blood pressure 106/65, pulse 80, temperature 98.3 F (36.8 C), temperature source Oral, resp. rate 20, height 5' (1.524 m), weight 90.7 kg (200 lb), last menstrual period 02/26/2018, SpO2 98 %.Body mass index is 39.06 kg/m.   General Appearance: Disheveled, young, obese, African American female who is wearing paper hospital scrubs with nose ring and lying in bed. NAD.   Eye Contact::  Good  Speech:  Clear and Coherent and Normal rate  Volume:  Normal  Mood:  "Okay"  Affect:  Congruent but labile at times when discussing stressors.   Thought Process:  Linear and Descriptions of Associations: Intact  Orientation:  Full (Time, Place, and Person)  Thought Content:  Logical  Suicidal Thoughts:  No  Homicidal Thoughts:  No  Memory:  Immediate;   Fair Recent;   Fair  Judgement:  Fair  Insight:  Shallow  Psychomotor Activity:  Normal  Concentration:  Fair  Recall:  [employer] of Knowledge:Fair  Language: Fair  Akathisia:  No  Handed:  Right  AIMS (if indicated):   N/A  Assets:  Communication Skills Desire for Improvement Housing Social Support  Sleep:   Poor  Cognition: WNL  ADL's:  Intact      Treatment Plan Summary: Daily contact with patient to assess and evaluate symptoms and progress in treatment, Medication management and Plan Will resume medications to include Abilify 5 mg p.o. daily times 21 days.  Patient to receive injectable Abilify 300mg  IM in a single dose prior to discharge.  She is to follow-up with outpatient services at [facility] until ACT services have been resume.  Disposition: No evidence of imminent risk to self or others at present.   Patient does not meet criteria for  psychiatric inpatient admission. Supportive therapy provided about ongoing stressors. Refer to IOP. Discussed crisis plan, support from social network, calling 911, coming to the Emergency Department, and calling Suicide Hotline.  Truman Hayward, FNP 03/29/2018 12:35 PM   Patient seen face-to-face for psychiatric evaluation, chart reviewed and case discussed with the physician extender and developed treatment plan. Reviewed the information documented and agree with the treatment plan.  Juanetta Beets, DO 03/29/18 7:48 PM

## 2018-03-29 NOTE — ED Notes (Signed)
CALLED AND SPOKE WITH DR DAN FLOYD. RN INQUIRED ABOUT LABWORK NOT BEING ORDERED ON PT.  DR DAN FLOYD STATES PT WAS MEDICALLY CLEARED BY HIM AND DID NOT NEED LAB WORK COMPLETED.
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
## 2018-03-29 NOTE — ED Notes (Signed)
Pt discharged home. Discharged instructions read to pt who verbalized understanding. All belongings returned to pt who signed for same. Denies SI/HI, is not delusional and not responding to internal stimuli. Escorted pt to the ED exit.   Pt was picked up by her care giver.

## 2018-03-29 NOTE — Telephone Encounter (Signed)
It looks like her qualitative RNA is neg so she needs PrEP appt.

## 2018-03-29 NOTE — BH Assessment (Signed)
Patient evaluated by Dr. Sharma [name] and [name], NP. Patient is psych cleared and ok to discharge. Patient provided with [facility].

## 2018-04-02 ENCOUNTER — Encounter: Payer: Self-pay | Admitting: Family

## 2018-04-02 ENCOUNTER — Other Ambulatory Visit (HOSPITAL_COMMUNITY)
Admission: RE | Admit: 2018-04-02 | Discharge: 2018-04-02 | Disposition: A | Discharge status: Home / Self Care | Payer: Medicare Other | Source: Ambulatory Visit | Attending: Family | Admitting: Family

## 2018-04-02 ENCOUNTER — Telehealth: Payer: Self-pay | Admitting: Behavioral Health

## 2018-04-02 ENCOUNTER — Ambulatory Visit: Payer: Medicare Other

## 2018-04-02 ENCOUNTER — Ambulatory Visit (INDEPENDENT_AMBULATORY_CARE_PROVIDER_SITE_OTHER): Payer: Medicare Other | Admitting: Family

## 2018-04-02 ENCOUNTER — Ambulatory Visit (INDEPENDENT_AMBULATORY_CARE_PROVIDER_SITE_OTHER): Payer: Medicare Other | Admitting: Pharmacist

## 2018-04-02 VITALS — BP 120/82 | HR 84 | Temp 98.0°F | Ht 60.0 in | Wt 220.0 lb

## 2018-04-02 DIAGNOSIS — Z7251 High risk heterosexual behavior: Secondary | ICD-10-CM | POA: Insufficient documentation

## 2018-04-02 DIAGNOSIS — R899 Unspecified abnormal finding in specimens from other organs, systems and tissues: Secondary | ICD-10-CM

## 2018-04-02 DIAGNOSIS — F250 Schizoaffective disorder, bipolar type: Secondary | ICD-10-CM | POA: Diagnosis not present

## 2018-04-02 NOTE — Patient Instructions (Signed)
Nice to meet you.  It appears you are negative for HIV based on your RNA level being negative.  HIGHLY RECOMMEND PrEP to prevent transmission of HIV.     Preventing HIV Infection and AIDS HIV (human immunodeficiency virus) infection is a long-term (chronic) viral infection. HIV kills white blood cells that help to control the body's defense (immune) system and fight infection. HIV spreads through semen, blood, breast milk, rectal fluid, and vaginal fluid. HIV is commonly spread through sexual contact and sharing needles or syringes, because these behaviors involve exchanging bodily fluids. Without treatment, HIV can turn into AIDS (acquired immunodeficiency syndrome), which is an advanced stage of HIV infection. AIDS is a very serious illness and can be life-threatening. What changes can I make to protect myself from HIV infection? Sexual contact To protect yourself from HIV through sexual contact:  Use devices that prevent body fluids from passing between partners (barrier protection) every time you have sex. Barrier protection can be used during oral, vaginal, or anal sex. Commonly used barrier methods include: ? Female condom. ? Female condom. ? Dental dam.  If you are at risk, ask your health care provider about taking medicine that can prevent HIV infection (pre-exposure prophylaxis, PrEP).  Get tested for HIV and know the HIV status of your sexual partner(s). Avoid having sex with partners without a known HIV status. If you or your partner is HIV-positive, use protection during sex.  Practice monogamy. This means you have only one sexual partner in your lifetime or only one partner at a time (serial monogamy).  Get tested and treated for STIs (sexually transmitted infections). Having an STI increases your risk for getting HIV.  The only way to completely prevent HIV from being spread through sexual contact is not to have any kind of sex (abstinence), including oral, vaginal, or anal  sex. Drug use To protect yourself from HIV through drug use:  Do notuse drugs, especially drugs that are injected.  Avoid having sex while under the influence of alcohol and drugs. Alcohol and drugs can affect your ability to make good decisions and may lead you to engage in high risk behaviors.  Do not share needles or syringes with anyone else. If you do share needles or syringes, consider taking PrEP to prevent HIV infection.  Blood and bodily fluid To protect yourself from HIV through exposure to blood and bodily fluids from a person who has HIV:  Cover any sores or wounds on yourself or the person with HIV.  If you need to touch blood or bodily fluids from an infected person, use gloves and wash your hands afterward.  Do not share items that touch bodily fluids or blood, such as toothbrushes or razors.  What can happen if I do not make these changes? If you do not make these changes:  You put yourself at risk of getting HIV from an infected person. HIV is a serious, life-threatening illness that cannot be cured. Having HIV makes it easier to get sick and more difficult to get well.  You can pass HIV on to others even if you don't know that you have it. An infected mother can also pass it to her children through pregnancy, childbirth, or breastfeeding.  You expose yourself to complications from the virus. Without treatment, the virus progresses. As it multiplies in your body, it causes the immune system to stop protecting you from infections and other health problems. You may get infections that you would not normally get if your  immune system was healthy and working properly (opportunistic diseases).  You put yourself at risk of side effects from HIV medicines. HIV medicines (antiretroviral therapy, ART) can help slow the virus from progressing and prevent its spread to others. People with HIV must take these medicines on a daily basis in order to live long, healthy lives. However,  these medicines have side effects. Long-term use of ART medicines can lead to chronic health conditions, such as damage to the liver and kidneys, diabetes, and heart disease. People who take HIV medicines must use protection during sex because they can still pass the virus on to sexual partners.  You could also put yourself at high risk for getting other sexually transmitted infections.  You put yourself at risk of having an unintended pregnancy.  Where to find support: To get support preventing HIV infection and AIDS:  Talk with your health care provider.  Visit your local health department or clinic.  Consider joining a support group.  Where to find more information: Learn more about HIV and AIDS from:  U.S. Department of Health and Human Services: www.aids.gov  Centers for Disease Control and Prevention: ? More information about preventing HIV: LocalChronicle.nowww.cdc.gov/hiv/basics/prevention.html ? How to find a location where you can get sexual health materials and treatment for free or for a low cost: gettested.TonerPromos.nocdc.gov  Summary  HIV spreads through semen, blood, breast milk, rectal fluid, and vaginal fluid.  HIV is commonly spread through sexual contact and sharing needles or syringes, because these behaviors lead to an exchange of bodily fluids.  To protect yourself from HIV through sexual contact, use a barrier protection method every time you have sex.  Avoid having sex while under the influence of alcohol and drugs. These substances may lead you to engage in high risk behaviors.  Get tested for HIV and make sure your sexual partner(s) get tested too. This information is not intended to replace advice given to you by your health care provider. Make sure you discuss any questions you have with your health care provider. Document Released: 09/26/2016 Document Revised: 09/26/2016 Document Reviewed: 09/26/2016 Elsevier Interactive Patient Education  [employer]2018 Elsevier Inc.

## 2018-04-02 NOTE — Progress Notes (Signed)
Subjective:    Patient ID: Rosanna Randy, female    DOB: [birth year], 32 y.o.   MRN: [id number]  Chief Complaint  Patient presents with  . Abnomal Lab  Test    HPI:  Magdala Chauncey Cruel Weill is a 32 y.o. female who presents today for an initial office visit to discuss an inconclusive HIV test.   Ms. Pulido was evaluated in the ED on 03/28/18 for symptoms of acute mania and feeling ill. She was noted to not be taking her medications and had been discharged from [facility]. Family was concerned about making poor decisions and using illegal drugs. She was also experiencing the associated symptoms of fatigue and malaise. With her blood work she was noted to have a positive HIV test with a negative confirmation test. There was initial concern for acute HIV infection versus a false positive test. HIV RNA levels returned as negative. ED records, labs and imaging were reviewed in detail.  Since leaving the ED she continues to experience the associated symptoms of fatigue. She did take one dose of Genvoya as PEP with concern for exposure to HIV. She is here to discuss the possibility of HIV infection based on this blood work. She is a very high risk for acquiring HIV as she has had numerous (130+) partners in the past several months.     Allergies  Allergen Reactions  . Vicodin [Hydrocodone-Acetaminophen] Hives and Nausea And Vomiting    Patient can tolerate acetaminophen      Outpatient Medications Prior to Visit  Medication Sig Dispense Refill  . ARIPiprazole (ABILIFY) 5 MG tablet Take 1 tablet (5 mg total) by mouth daily. 21 tablet 0  . benztropine (COGENTIN) 1 MG tablet Take 1 tablet (1 mg total) by mouth daily. 30 tablet 0  . elvitegravir-cobicistat-emtricitabine-tenofovir (GENVOYA) 150-150-200-10 MG TABS tablet Take 1 tablet by mouth daily with breakfast. (Patient not taking: Reported on 04/02/2018) 5 tablet 0  . hydrOXYzine (ATARAX/VISTARIL) 25 MG tablet Take 1 tablet (25 mg total) by mouth 3  (three) times daily as needed for anxiety (sleep). 30 tablet 0   No facility-administered medications prior to visit.      Past Medical History:  Diagnosis Date  . Anxiety   . Bipolar 1 disorder ([place])   . Obesity   . Schizo affective schizophrenia [facility])       Past Surgical History:  Procedure Laterality Date  . DENTAL SURGERY        History reviewed. No pertinent family history.    Social History   Socioeconomic History  . Marital status: Single    Spouse name: Not on file  . Number of children: Not on file  . Years of education: Not on file  . Highest education level: Not on file  Occupational History  . Not on file  Social Needs  . Financial resource strain: Not on file  . Food insecurity:    Worry: Not on file    Inability: Not on file  . Transportation needs:    Medical: Not on file    Non-medical: Not on file  Tobacco Use  . Smoking status: Current Every Day Smoker    Packs/day: 0.10    Types: Cigarettes  . Smokeless tobacco: Never Used  Substance and Sexual Activity  . Alcohol use: Yes  . Drug use: Yes    Types: Marijuana  . Sexual activity: Yes    Birth control/protection: None  Lifestyle  . Physical activity:    Days per week:  Not on file    Minutes per session: Not on file  . Stress: Not on file  Relationships  . Social connections:    Talks on phone: Not on file    Gets together: Not on file    Attends religious service: Not on file    Active member of club or organization: Not on file    Attends meetings of clubs or organizations: Not on file    Relationship status: Not on file  . Intimate partner violence:    Fear of current or ex partner: Not on file    Emotionally abused: Not on file    Physically abused: Not on file    Forced sexual activity: Not on file  Other Topics Concern  . Not on file  Social History Narrative  . Not on file      Review of Systems  Constitutional: Positive for fatigue. Negative for activity  change, appetite change, diaphoresis, fever and unexpected weight change.  HENT: Negative for congestion, sinus pressure and sore throat.   Respiratory: Negative for cough, chest tightness, shortness of breath and wheezing.   Cardiovascular: Negative for chest pain and leg swelling.  Gastrointestinal: Negative for abdominal pain, constipation, diarrhea, nausea and vomiting.  Genitourinary: Negative for dysuria, flank pain, frequency, genital sores, hematuria and urgency.  Neurological: Negative for weakness and headaches.       Objective:    BP 120/82   Pulse 84   Temp 98 F (36.7 C) (Oral)   Ht 5' (1.524 m)   Wt 220 lb (99.8 kg)   LMP 02/20/2018 (Approximate)   BMI 42.97 kg/m  Nursing note and vital signs reviewed.  Physical Exam  Constitutional: She is oriented to person, place, and time. She appears well-developed. No distress.  She appears disheveled. Pleasant.   HENT:  Mouth/Throat: Oropharynx is clear and moist.  Eyes: Conjunctivae are normal.  Neck: Neck supple.  Cardiovascular: Normal rate, regular rhythm, normal heart sounds and intact distal pulses. Exam reveals no gallop and no friction rub.  No murmur heard. Pulmonary/Chest: Effort normal and breath sounds normal. No respiratory distress. She has no wheezes. She has no rales. She exhibits no tenderness.  Abdominal: Soft. Bowel sounds are normal. There is no tenderness.  Lymphadenopathy:    She has no cervical adenopathy.  Neurological: She is alert and oriented to person, place, and time.  Skin: Skin is warm and dry. No rash noted.  Psychiatric: She has a normal mood and affect. Her behavior is normal. Judgment and thought content normal.        Assessment & Plan:   Problem List Items Addressed This Visit      Other   Abnormal laboratory test - Primary    Ms. Highland does not have HIV based on her lab work completed in the ED as the confirmatory HIV-1/HIV-2 test was negative and there was no viral load that  was detected. She would have had a positive viral load by that point, especially if acutely infected. After counseling she is interested in PrEP and has met with Cassie from our pharmacy staff to initiate PrEP given her significantly increased risk for acquiring HIV in the future based on previous behaviors.           I am having Esthela S. Mooneyhan maintain her elvitegravir-cobicistat-emtricitabine-tenofovir, hydrOXYzine, benztropine, and ARIPiprazole.    Follow-up: Return if symptoms worsen or fail to improve, for and with [facility] for PrEP.  Mauricio Po, [facility] for Infectious  Disease

## 2018-04-02 NOTE — Progress Notes (Signed)
Date:  04/02/2018   Insured   [x]    Uninsured  []    HPI  Sabrina Galloway is a 32 y.o. female who presents today for her initial PrEP visit.   Demographics Race:  Black or African American [2] Marital Status:  Single  Allergies Allergies  Allergen Reactions  . Vicodin [Hydrocodone-Acetaminophen] Hives and Nausea And Vomiting    Patient can tolerate acetaminophen   Past Medical History:  Diagnosis Date  . Anxiety   . Bipolar 1 disorder (HCC)   . Obesity   . Schizo affective schizophrenia [facility](HCC)    Outpatient Encounter Medications as of 04/02/2018  Medication Sig  . ARIPiprazole (ABILIFY) 5 MG tablet Take 1 tablet (5 mg total) by mouth daily.  . benztropine (COGENTIN) 1 MG tablet Take 1 tablet (1 mg total) by mouth daily.  . hydrOXYzine (ATARAX/VISTARIL) 25 MG tablet Take 1 tablet (25 mg total) by mouth 3 (three) times daily as needed for anxiety (sleep).  . elvitegravir-cobicistat-emtricitabine-tenofovir (GENVOYA) 150-150-200-10 MG TABS tablet Take 1 tablet by mouth daily with breakfast. (Patient not taking: Reported on 04/02/2018)   No facility-administered encounter medications on file as of 04/02/2018.    Social History   Tobacco Use  Smoking Status Current Every Day Smoker  . Packs/day: 0.10  . Types: Cigarettes  Smokeless Tobacco Never Used   Social History   Substance and Sexual Activity  Alcohol Use Yes   Drug use?   Yes [x]  No []  -Marijuana  Injectable drug use?   Yes []     No [x]   Sexual History  Missing doses? Yes []    No  []  N/A  CHL HIV PREP FLOWSHEET RESULTS 04/02/2018  Insurance Status Insured  Gender at birth Female  Gender identity cis-Female  Risk for HIV >5 partners in past 6 mos (regardless of condom use);Condomless vaginal or anal intercourse;Sex worker;Hx of STI  Sex Partners Men only  # sex partners past 3-6 mos >12  Sex activity preferences Oral;Receptive  Condom use Yes  % condom use 8980  Partners genders and ages 92M 20-24;M 25-29;M  30-49;M 50+  Treated for STI? No  PrEP Eligibility HIV negative;Substantial risk for HIV  Preg status No  Breastfeeding? N/A   Labs: Creatinine Lab Results  Component Value Date   CREATININE 0.59 03/21/2018   CREATININE 0.67 12/23/2013   CREATININE 0.58 03/03/2009   HIV Lab Results  Component Value Date   HIV Reactive (A) 03/21/2018   HIV NON REACTIVE 12/23/2013   GFR Estimated Creatinine Clearance: 107.1 mL/min (by C-G formula based on SCr of 0.59 mg/dL).  Hepatitis B No results found for: HEPBSAB Lab Results  Component Value Date   HEPBSAG Negative 03/21/2018   Hepatitis C Lab Results  Component Value Date   HCVAB <0.1 03/21/2018   Hepatitis A No results found for: HAV  RPR and STI Lab Results  Component Value Date   LABRPR Non Reactive 03/21/2018   LABRPR NON REACTIVE 07/12/2009   LABRPR NON REACTIVE 10/11/2007    STI Results GC CT CT  Latest Ref Rng & Units NEGATIVE NEGATIVE NEGATIVE  12/23/2013 NEGATIVE - NEGATIVE  07/12/2009 - POSITIVE (NOTE)  Testing performed using the BD Probetec ET Chlamydia trachomatis and Neisseria gonorrhea amplified DNA assay.(A) -  10/11/2007 - NEGATIVE (NOTE)  Testing performed using the BD Probetec ET Chlamydia trachomatis and Neisseria gonorrhea amplified DNA assay. -   Assessment  Genesys presents to the clinic today interested in initiating PrEP. Her initial fourth generation HIV  tests resulted reactive, but her confirmatory qualitative RNA test resulted HIV negative. She was educated that she currently does not have HIV but is at extremely high risk.  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 She has vaginal sex and oral sex only, never anal sex. She reports condom use as 80% of the time, but has also admitted she gets paid more when she doesn't use condoms, so I estimate this percentage is much lower. When questioned what other birth control she is on, she said nothing. She  also endorses an addition to marijuana, but no other illicit drugs. She does not inject drugs or snort drugs. She drinks occasionally.  She reports that she has not been tested for STIs since 2017, but has had an STI when she was younger. She states that "she knows when she has an STI", as she can tell by her discharge, etc. She is very high risk for pregnancy, STIs, and HIV.   She was educated today on what Truvada is, what the medicine ingredients are, what the indication is, and what the expected side effects are. She was instructed to take this pill once daily and adherence was highly encouraged. The risk of resistance was educated if she were to contract HIV.   Recommendations  HIV 1 RNA , Hep A/B/C Ab today Pregnancy test, oral swab today as well RPR and BMET Urine cytology 1 month of Truvada if labs okay F/u with pharmacy 7/10 @ 11 am   Diana L. Marcy Salvo, PharmD, MS PGY1 Pharmacy Resident

## 2018-04-02 NOTE — Telephone Encounter (Signed)
Patient called stating she is going to come in today for her appointment but is unable to locate her ID.  Patient states she is in the process of getting a new one.  Writer informed her to still come in an to bring her insurance cards.  Patient verbalized understanding. Angeline SlimAshley Hill RN

## 2018-04-03 ENCOUNTER — Ambulatory Visit: Payer: Medicare Other

## 2018-04-03 ENCOUNTER — Encounter: Payer: Self-pay | Admitting: Family

## 2018-04-03 DIAGNOSIS — R899 Unspecified abnormal finding in specimens from other organs, systems and tissues: Secondary | ICD-10-CM | POA: Insufficient documentation

## 2018-04-03 LAB — HEPATITIS A ANTIBODY, TOTAL: Hepatitis A AB,Total: NONREACTIVE

## 2018-04-03 LAB — BASIC METABOLIC PANEL
BUN: 10 mg/dL (ref 7–25)
CO2: 23 mmol/L (ref 20–32)
Calcium: 9.3 mg/dL (ref 8.6–10.2)
Chloride: 103 mmol/L (ref 98–110)
Creat: 0.6 mg/dL (ref 0.50–1.10)
Glucose, Bld: 78 mg/dL (ref 65–99)
POTASSIUM: 4.1 mmol/L (ref 3.5–5.3)
SODIUM: 139 mmol/L (ref 135–146)

## 2018-04-03 LAB — HEPATITIS B SURFACE ANTIBODY,QUALITATIVE: Hep B S Ab: REACTIVE — AB

## 2018-04-03 LAB — HEPATITIS C ANTIBODY
Hepatitis C Ab: NONREACTIVE
SIGNAL TO CUT-OFF: 0.08 (ref <1.00)

## 2018-04-03 LAB — RPR: RPR Ser Ql: NONREACTIVE

## 2018-04-03 NOTE — Assessment & Plan Note (Signed)
Sabrina Galloway does not have HIV based on her lab work completed in the ED as the confirmatory HIV-1/HIV-2 test was negative and there was no viral load that was detected. She would have had a positive viral load by that point, especially if acutely infected. After counseling she is interested in PrEP and has met with Cassie from our pharmacy staff to initiate PrEP given her significantly increased risk for acquiring HIV in the future based on previous behaviors.

## 2018-04-04 ENCOUNTER — Encounter: Payer: Self-pay | Admitting: Pharmacy Technician

## 2018-04-04 ENCOUNTER — Telehealth: Payer: Self-pay | Admitting: Pharmacist

## 2018-04-04 ENCOUNTER — Telehealth: Payer: Self-pay | Admitting: Pharmacy Technician

## 2018-04-04 DIAGNOSIS — F250 Schizoaffective disorder, bipolar type: Secondary | ICD-10-CM | POA: Diagnosis not present

## 2018-04-04 DIAGNOSIS — Z7251 High risk heterosexual behavior: Secondary | ICD-10-CM

## 2018-04-04 LAB — DRUG PROFILE, UR, 9 DRUGS (LABCORP)
Amphetamines, Urine: NEGATIVE ng/mL
BARBITURATE, UR: NEGATIVE ng/mL
Benzodiazepine Quant, Ur: NEGATIVE ng/mL
COCAINE (METAB.): NEGATIVE ng/mL
Cannabinoid Quant, Ur: POSITIVE ng/mL — AB
Methadone Screen, Urine: NEGATIVE ng/mL
OPIATE QUANT UR: NEGATIVE ng/mL
PROPOXYPHENE, URINE: NEGATIVE ng/mL
Phencyclidine, Ur: NEGATIVE ng/mL

## 2018-04-04 LAB — URINE CYTOLOGY ANCILLARY ONLY
Chlamydia: NEGATIVE
NEISSERIA GONORRHEA: NEGATIVE

## 2018-04-04 LAB — CYTOLOGY, (ORAL, ANAL, URETHRAL) ANCILLARY ONLY
Chlamydia: NEGATIVE
Neisseria Gonorrhea: POSITIVE — AB

## 2018-04-04 MED ORDER — EMTRICITABINE-TENOFOVIR DF 200-300 MG PO TABS: 1.0000 | ORAL_TABLET | Freq: Every day | ORAL | 0 refills | Status: DC

## 2018-04-04 MED FILL — TRUVADA 200-300 MG TABS: 200-300 | 30 days supply | Qty: 30 | Fill #0

## 2018-04-04 NOTE — Telephone Encounter (Signed)
Agree with treatment plan.

## 2018-04-04 NOTE — Telephone Encounter (Signed)
Patient's oral STD swab came back positive for gonorrhea.  Called patient and she will come in tomorrow to get treatment.  She will need ceftriaxone 250 mg IM x 1 + azithromycin 1 gm PO x 1.

## 2018-04-04 NOTE — Telephone Encounter (Signed)
Must bring SSI award letter so I can get her continued copay assistance ($3.80)  She does not have a credit card so this makes it flow for pharmacy to mail with out.

## 2018-04-05 ENCOUNTER — Ambulatory Visit: Payer: Self-pay

## 2018-04-07 LAB — HIV-1 RNA ULTRAQUANT REFLEX TO GENTYP+
HIV 1 RNA Quant: <20 copies/mL
HIV-1 RNA Quant, Log: <1.3 Log copies/mL

## 2018-04-09 DIAGNOSIS — F250 Schizoaffective disorder, bipolar type: Secondary | ICD-10-CM | POA: Diagnosis not present

## 2018-04-11 DIAGNOSIS — F250 Schizoaffective disorder, bipolar type: Secondary | ICD-10-CM | POA: Diagnosis not present

## 2018-04-16 ENCOUNTER — Telehealth: Payer: Self-pay | Admitting: Pharmacist

## 2018-04-16 NOTE — Telephone Encounter (Signed)
Sabrina Galloway no showed for her nurse appointment to get treatment for pharyngeal gonorrhea. She also gave us a fake address that did not exist when we mailed her Truvada to her. Unable to reach her by phone lately.

## 2018-04-18 DIAGNOSIS — F250 Schizoaffective disorder, bipolar type: Secondary | ICD-10-CM | POA: Diagnosis not present

## 2018-04-18 NOTE — Telephone Encounter (Signed)
Noted! Thank you

## 2018-04-19 DIAGNOSIS — F250 Schizoaffective disorder, bipolar type: Secondary | ICD-10-CM | POA: Diagnosis not present

## 2018-04-24 DIAGNOSIS — F250 Schizoaffective disorder, bipolar type: Secondary | ICD-10-CM | POA: Diagnosis not present

## 2018-04-27 DIAGNOSIS — F250 Schizoaffective disorder, bipolar type: Secondary | ICD-10-CM | POA: Diagnosis not present

## 2018-05-01 ENCOUNTER — Ambulatory Visit: Payer: Medicare Other

## 2018-05-01 DIAGNOSIS — F250 Schizoaffective disorder, bipolar type: Secondary | ICD-10-CM | POA: Diagnosis not present

## 2018-05-02 DIAGNOSIS — F250 Schizoaffective disorder, bipolar type: Secondary | ICD-10-CM | POA: Diagnosis not present

## 2018-05-08 DIAGNOSIS — F250 Schizoaffective disorder, bipolar type: Secondary | ICD-10-CM | POA: Diagnosis not present

## 2018-05-17 DIAGNOSIS — F250 Schizoaffective disorder, bipolar type: Secondary | ICD-10-CM | POA: Diagnosis not present

## 2018-05-22 DIAGNOSIS — F250 Schizoaffective disorder, bipolar type: Secondary | ICD-10-CM | POA: Diagnosis not present

## 2018-05-24 DIAGNOSIS — F250 Schizoaffective disorder, bipolar type: Secondary | ICD-10-CM | POA: Diagnosis not present

## 2018-05-29 ENCOUNTER — Telehealth: Payer: Self-pay | Admitting: *Deleted

## 2018-05-29 DIAGNOSIS — F250 Schizoaffective disorder, bipolar type: Secondary | ICD-10-CM | POA: Diagnosis not present

## 2018-05-29 NOTE — Telephone Encounter (Signed)
RN reached out for follow up to Sabrina Galloway. She answered the phone, expressed interest in coming back for PrEP. She asked if she could come on Friday morning. She is scheduled for 8/9 at 9:15. Andree CossHowell, Ashir Kunz M, RN

## 2018-05-29 NOTE — Telephone Encounter (Signed)
Thank Marcelino DusterMichelle!

## 2018-05-31 ENCOUNTER — Ambulatory Visit: Payer: Self-pay

## 2018-06-06 DIAGNOSIS — F250 Schizoaffective disorder, bipolar type: Secondary | ICD-10-CM | POA: Diagnosis not present

## 2018-06-14 DIAGNOSIS — F250 Schizoaffective disorder, bipolar type: Secondary | ICD-10-CM | POA: Diagnosis not present

## 2018-07-10 DIAGNOSIS — F250 Schizoaffective disorder, bipolar type: Secondary | ICD-10-CM | POA: Diagnosis not present

## 2018-07-11 DIAGNOSIS — F250 Schizoaffective disorder, bipolar type: Secondary | ICD-10-CM | POA: Diagnosis not present

## 2018-07-15 DIAGNOSIS — F250 Schizoaffective disorder, bipolar type: Secondary | ICD-10-CM | POA: Diagnosis not present

## 2018-07-22 DIAGNOSIS — F250 Schizoaffective disorder, bipolar type: Secondary | ICD-10-CM | POA: Diagnosis not present

## 2018-07-24 DIAGNOSIS — F250 Schizoaffective disorder, bipolar type: Secondary | ICD-10-CM | POA: Diagnosis not present

## 2018-08-01 DIAGNOSIS — F250 Schizoaffective disorder, bipolar type: Secondary | ICD-10-CM | POA: Diagnosis not present

## 2018-08-08 DIAGNOSIS — F250 Schizoaffective disorder, bipolar type: Secondary | ICD-10-CM | POA: Diagnosis not present

## 2018-08-12 ENCOUNTER — Ambulatory Visit (INDEPENDENT_AMBULATORY_CARE_PROVIDER_SITE_OTHER): Payer: Medicare Other | Admitting: Pharmacist

## 2018-08-12 ENCOUNTER — Telehealth: Payer: Self-pay

## 2018-08-12 ENCOUNTER — Other Ambulatory Visit (HOSPITAL_COMMUNITY)
Admission: RE | Admit: 2018-08-12 | Discharge: 2018-08-12 | Disposition: A | Discharge status: Home / Self Care | Payer: Medicare Other | Source: Ambulatory Visit | Attending: Family | Admitting: Family

## 2018-08-12 DIAGNOSIS — Z7251 High risk heterosexual behavior: Secondary | ICD-10-CM

## 2018-08-12 DIAGNOSIS — F250 Schizoaffective disorder, bipolar type: Secondary | ICD-10-CM | POA: Diagnosis not present

## 2018-08-12 DIAGNOSIS — Z114 Encounter for screening for human immunodeficiency virus [HIV]: Secondary | ICD-10-CM | POA: Diagnosis not present

## 2018-08-12 MED ORDER — CEFTRIAXONE SODIUM 250 MG IJ SOLR: 250.0000 mg | Freq: Once | INTRAMUSCULAR | 0 refills | Status: DC

## 2018-08-12 MED ORDER — AZITHROMYCIN 250 MG PO TABS: ORAL_TABLET | ORAL | 0 refills | Status: DC

## 2018-08-12 NOTE — Telephone Encounter (Signed)
Disregard azithromycin script sent to pharmacy This was given in the office and sent to pharmacy by error.   Azithromycin 1000 mg given po.   Laurell Josephs, RN

## 2018-08-12 NOTE — Progress Notes (Signed)
Date:  08/12/2018   HPI: Sabrina Galloway is a 32 y.o. female who presents to [facility] for PrEP.  Insured   [x]    Uninsured  []    Patient Active Problem List   Diagnosis Date Noted  . Abnormal laboratory test 04/03/2018  . Schizoaffective disorder, bipolar type (HCC)   . 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   BENZTROPINE (COGENTIN) 1 MG TABLET    Take 1 tablet (1 mg total) by mouth daily.   EMTRICITABINE-TENOFOVIR (TRUVADA) 200-300 MG TABLET    Take 1 tablet by mouth daily.   HYDROXYZINE (ATARAX/VISTARIL) 25 MG TABLET    Take 1 tablet (25 mg total) by mouth 3 (three) times daily as needed for anxiety (sleep).  Modified Medications   No medications on file  Discontinued Medications   No medications on file    Allergies: Allergies  Allergen Reactions  . Vicodin [Hydrocodone-Acetaminophen] Hives and Nausea And Vomiting    Patient can tolerate acetaminophen    Past Medical History: Past Medical History:  Diagnosis Date  . Anxiety   . Bipolar 1 disorder (HCC)   . Obesity   . Schizo affective schizophrenia [facility])     Social History: Social History   Socioeconomic History  . Marital status: Single    Spouse name: Not on file  . Number of children: Not on file  . Years of education: Not on file  . Highest education level: Not on file  Occupational History  . Not on file  Social Needs  . Financial resource strain: Not on file  . Food insecurity:    Worry: Not on file    Inability: Not on file  . Transportation needs:    Medical: Not on file    Non-medical: Not on file  Tobacco Use  . Smoking status: Current Every Day Smoker    Packs/day: 0.10    Types: Cigarettes  . Smokeless tobacco: Never Used  Substance and Sexual Activity  . Alcohol use: Yes  . Drug use: Yes    Types: Marijuana  .  Sexual activity: Yes    Birth control/protection: None  Lifestyle  . Physical activity:    Days per week: Not on file    Minutes per session: Not on file  . Stress: Not on file  Relationships  . Social connections:    Talks on phone: Not on file    Gets together: Not on file    Attends religious service: Not on file    Active member of club or organization: Not on file    Attends meetings of clubs or organizations: Not on file    Relationship status: Not on file  Other Topics Concern  . Not on file  Social History Narrative  . Not on file    [facility] HIV PREP FLOWSHEET RESULTS 08/12/2018 04/02/2018  Insurance Status Insured Insured  Gender at birth Female Female  Gender identity cis-Female cis-Female  Risk for HIV >5 partners in past 6 mos (regardless of condom use);Sex worker;Condomless vaginal or anal intercourse;Hx of STI >5 partners in past 6 mos (regardless of condom use);Condomless vaginal or anal intercourse;Sex worker;Hx of STI  Sex Partners Men only Men only  # sex partners past 3-6 mos >12 >12  Sex activity preferences Receptive;Oral Oral;Receptive  Condom use  Yes Yes  % condom use 90 58  Partners genders and ages 23 30-49;M 25-29;M 20-24;M 50+ M 20-24;M 25-29;M 30-49;M 50+  Treated for STI? - No  PrEP Eligibility Substantial risk for HIV;CrCl >60 ml/min;Past treatment for potential exposure to HIV from sex or injectable drugs HIV negative;Substantial risk for HIV  Preg status No No  Breastfeeding? No N/A  Paper work received? Yes -    Labs:  SCr: Lab Results  Component Value Date   CREATININE 0.60 04/02/2018   CREATININE 0.59 03/21/2018   CREATININE 0.67 12/23/2013   CREATININE 0.58 03/03/2009   HIV Lab Results  Component Value Date   HIV Reactive (A) 03/21/2018   HIV NON REACTIVE 12/23/2013   Hepatitis B Lab Results  Component Value Date   HEPBSAB REACTIVE (A) 04/02/2018   HEPBSAG Negative 03/21/2018   Hepatitis C Lab Results  Component Value Date     HEPCAB NON-REACTIVE 04/02/2018   Hepatitis A Lab Results  Component Value Date   HAV NON-REACTIVE 04/02/2018   RPR and STI Lab Results  Component Value Date   LABRPR NON-REACTIVE 04/02/2018   LABRPR Non Reactive 03/21/2018   LABRPR NON REACTIVE 07/12/2009   LABRPR NON REACTIVE 10/11/2007    STI Results GC GC CT CT  Latest Ref Rng & Units - NEGATIVE - NEGATIVE  04/02/2018 Negative - Negative -  04/02/2018 **POSITIVE**(A) - Negative -  12/23/2013 - NEGATIVE - NEGATIVE  07/12/2009 - - POSITIVERTFDELIM(NOTE)  Testing performed using the BD Probetec ET Chlamydia trachomatis and Neisseria gonorrhea amplified DNA assay.(A) -  10/11/2007 - - NEGATIVERTFDELIM(NOTE)  Testing performed using the BD Probetec ET Chlamydia trachomatis and Neisseria gonorrhea amplified DNA assay. -    Assessment: Sabrina Galloway presents to clinic her care giver ("like her aunt"), Sabrina Galloway, for PrEP. She was previously seen in clinic in June to start PrEP. Patient received one month of Truvada and reports adherence during the month, however, Sabrina Galloway shakes her head during this questioning. She denies fatigue or headaches while taking Truvada. Patient counseled on the importance of taking Truvada every day for protection against HIV. Patient was also counseled to continue using condoms as PrEP only helps prevent HIV and not pregnancy or other STIs. Sabrina Galloway inquired about drug interactions of Truvada with Sabrina Galloway's current medications. There are no current interactions. Patient's number and address were confirmed. Sabrina Galloway request to call her with results. When [facility] left room asked patient who she would like results called in to and she requested just herself. Later, under instructions from Sabrina Galloway, Sabrina Galloway agreed to allow results be called to Sabrina Galloway, however, this felt coherenced.   Sabrina Galloway is at very high risk for HIV having a large number of sex partners. She preforms oral sex and vaginal sex. She reports condom use "all the time" but then  agrees to ~80%. Unsure of exact condom use. In previous visit patient reports she gets paid more if she does not use condoms. Patient was given condoms today.  Patient is not currently on birth control however does not wish to become pregnant. Patient is using condoms for birth control. She reports previous use of birth control and is followed with [facility]. She was encouraged to follow up with her OBGYN for pregnancy prevention.    In June, she tested positive for oral gonorrhea. She denies treatment for any STIs since that appointment, and reports having chlamydia when she was in her 65s. She was given ceftriaxone and azithromycin in clinic for treatment. She was counseled to  refrain from oral sex for 7-10 days. Patient refused flu and hep a vaccines. Follow up in one month with Sabrina Galloway.   Plan: - treatment with ceftriaxone 250mg  IM x1 and azithromycin 1g po x1 for positive gonorrhea today   - start Truvada for PrEP pending negative HIV  - HIV antibody, BMET, STD pannel, hCG - F/u with Sabrina Galloway 11/21 @9 :15   Update: patient refused urine collection   Amanda Pea, Pharm D PGY1 Pharmacy Resident  08/12/2018      9:56 AM

## 2018-08-12 NOTE — Telephone Encounter (Signed)
08-12-18 Patient is here today to see pharmacy.  She will need treatment for positive gonorrhea from 04-04-18.    Patient was given ceftriaxone 250 mg IM along with 1000 mg of azithromycin by mouth.     Laurell Josephs, RN

## 2018-08-12 NOTE — Patient Instructions (Signed)
It was great to see you today!  We will call you with results tomorrow and go over the plan for PrEP.

## 2018-08-12 NOTE — Addendum Note (Signed)
Addended by: Laurell Josephs on: 08/12/2018 10:49 AM   Modules accepted: Orders

## 2018-08-13 ENCOUNTER — Telehealth: Payer: Self-pay

## 2018-08-13 DIAGNOSIS — Z7251 High risk heterosexual behavior: Secondary | ICD-10-CM

## 2018-08-13 LAB — BASIC METABOLIC PANEL
BUN: 8 mg/dL (ref 7–25)
CHLORIDE: 102 mmol/L (ref 98–110)
CO2: 26 mmol/L (ref 20–32)
CREATININE: 0.73 mg/dL (ref 0.50–1.10)
Calcium: 9.5 mg/dL (ref 8.6–10.2)
GLUCOSE: 97 mg/dL (ref 65–99)
POTASSIUM: 4.3 mmol/L (ref 3.5–5.3)
SODIUM: 137 mmol/L (ref 135–146)

## 2018-08-13 LAB — RPR: RPR Ser Ql: NONREACTIVE

## 2018-08-13 LAB — HIV ANTIBODY (ROUTINE TESTING W REFLEX): HIV: NONREACTIVE

## 2018-08-13 LAB — CYTOLOGY, (ORAL, ANAL, URETHRAL) ANCILLARY ONLY
Chlamydia: NEGATIVE
Neisseria Gonorrhea: NEGATIVE

## 2018-08-13 MED ORDER — EMTRICITABINE-TENOFOVIR DF 200-300 MG PO TABS: 1.0000 | ORAL_TABLET | Freq: Every day | ORAL | 1 refills | Status: AC

## 2018-08-13 NOTE — Addendum Note (Signed)
Addended by: Robinette Haines on: 08/13/2018 03:26 PM   Modules accepted: Orders

## 2018-08-13 NOTE — Telephone Encounter (Signed)
Called [facility] to report her results. HIPAA identifiers were confirmed. She is negative for HIV and all STDs tested for. Counseled patient on the importance of taking her medication daily. She will refill using [facility] and requests Truvada to be mailed to her house. Address confirmed with patient.   Amanda Pea, Ilda Basset D PGY1 Pharmacy Resident  08/13/2018      3:00 PM

## 2018-08-14 MED FILL — TRUVADA 200-300 MG TABS: 200-300 | 30 days supply | Qty: 30 | Fill #0

## 2018-08-15 DIAGNOSIS — F250 Schizoaffective disorder, bipolar type: Secondary | ICD-10-CM | POA: Diagnosis not present

## 2018-08-21 DIAGNOSIS — F250 Schizoaffective disorder, bipolar type: Secondary | ICD-10-CM | POA: Diagnosis not present

## 2018-08-22 DIAGNOSIS — F250 Schizoaffective disorder, bipolar type: Secondary | ICD-10-CM | POA: Diagnosis not present

## 2018-08-30 DIAGNOSIS — F250 Schizoaffective disorder, bipolar type: Secondary | ICD-10-CM | POA: Diagnosis not present

## 2018-09-05 DIAGNOSIS — F250 Schizoaffective disorder, bipolar type: Secondary | ICD-10-CM | POA: Diagnosis not present

## 2018-09-09 MED FILL — TRUVADA 200-300 MG TABS: 200-300 | 30 days supply | Qty: 30 | Fill #1

## 2018-09-12 ENCOUNTER — Ambulatory Visit: Payer: Medicare Other

## 2018-09-18 DIAGNOSIS — F250 Schizoaffective disorder, bipolar type: Secondary | ICD-10-CM | POA: Diagnosis not present

## 2018-09-26 DIAGNOSIS — F250 Schizoaffective disorder, bipolar type: Secondary | ICD-10-CM | POA: Diagnosis not present

## 2018-10-03 ENCOUNTER — Other Ambulatory Visit: Payer: Self-pay

## 2018-10-03 ENCOUNTER — Ambulatory Visit: Payer: Medicare Other

## 2018-10-03 DIAGNOSIS — F250 Schizoaffective disorder, bipolar type: Secondary | ICD-10-CM | POA: Diagnosis not present

## 2018-10-17 ENCOUNTER — Encounter: Payer: Self-pay | Admitting: Family

## 2018-10-29 DIAGNOSIS — E559 Vitamin D deficiency, unspecified: Secondary | ICD-10-CM | POA: Diagnosis not present

## 2018-10-29 DIAGNOSIS — R7303 Prediabetes: Secondary | ICD-10-CM | POA: Diagnosis not present

## 2018-10-29 DIAGNOSIS — H538 Other visual disturbances: Secondary | ICD-10-CM | POA: Diagnosis not present

## 2018-10-29 DIAGNOSIS — D500 Iron deficiency anemia secondary to blood loss (chronic): Secondary | ICD-10-CM | POA: Diagnosis not present

## 2018-10-29 DIAGNOSIS — Z720 Tobacco use: Secondary | ICD-10-CM | POA: Diagnosis not present

## 2018-10-29 DIAGNOSIS — M545 Low back pain: Secondary | ICD-10-CM | POA: Diagnosis not present

## 2018-10-29 DIAGNOSIS — F419 Anxiety disorder, unspecified: Secondary | ICD-10-CM | POA: Diagnosis not present

## 2018-10-29 DIAGNOSIS — Z0001 Encounter for general adult medical examination with abnormal findings: Secondary | ICD-10-CM | POA: Diagnosis not present

## 2018-10-29 DIAGNOSIS — Z9189 Other specified personal risk factors, not elsewhere classified: Secondary | ICD-10-CM | POA: Diagnosis not present

## 2018-10-30 ENCOUNTER — Ambulatory Visit: Payer: Medicare Other

## 2018-10-31 DIAGNOSIS — F250 Schizoaffective disorder, bipolar type: Secondary | ICD-10-CM | POA: Diagnosis not present

## 2018-12-12 DIAGNOSIS — F250 Schizoaffective disorder, bipolar type: Secondary | ICD-10-CM | POA: Diagnosis not present

## 2018-12-24 DIAGNOSIS — F250 Schizoaffective disorder, bipolar type: Secondary | ICD-10-CM | POA: Diagnosis not present

## 2019-01-02 DIAGNOSIS — F250 Schizoaffective disorder, bipolar type: Secondary | ICD-10-CM | POA: Diagnosis not present

## 2019-01-09 DIAGNOSIS — F250 Schizoaffective disorder, bipolar type: Secondary | ICD-10-CM | POA: Diagnosis not present

## 2019-01-24 DIAGNOSIS — F250 Schizoaffective disorder, bipolar type: Secondary | ICD-10-CM | POA: Diagnosis not present

## 2019-02-10 DIAGNOSIS — F250 Schizoaffective disorder, bipolar type: Secondary | ICD-10-CM | POA: Diagnosis not present

## 2019-02-14 DIAGNOSIS — F250 Schizoaffective disorder, bipolar type: Secondary | ICD-10-CM | POA: Diagnosis not present

## 2019-02-26 DIAGNOSIS — F250 Schizoaffective disorder, bipolar type: Secondary | ICD-10-CM | POA: Diagnosis not present

## 2019-03-07 DIAGNOSIS — F250 Schizoaffective disorder, bipolar type: Secondary | ICD-10-CM | POA: Diagnosis not present

## 2019-06-24 DIAGNOSIS — F250 Schizoaffective disorder, bipolar type: Secondary | ICD-10-CM | POA: Diagnosis not present

## 2020-01-15 DIAGNOSIS — F250 Schizoaffective disorder, bipolar type: Secondary | ICD-10-CM | POA: Diagnosis not present

## 2020-04-29 ENCOUNTER — Encounter: Payer: Self-pay | Admitting: Family Medicine

## 2020-04-29 ENCOUNTER — Encounter: Payer: Medicare Other | Admitting: Family Medicine

## 2020-04-29 NOTE — Progress Notes (Signed)
Patient did not keep appointment today. She may call to reschedule.  

## 2021-10-04 ENCOUNTER — Emergency Department (HOSPITAL_COMMUNITY)
Admission: EM | Admit: 2021-10-04 | Discharge: 2021-10-04 | Disposition: A | Discharge status: Home / Self Care | Payer: Medicaid Other | Attending: Emergency Medicine | Admitting: Emergency Medicine

## 2021-10-04 ENCOUNTER — Other Ambulatory Visit: Payer: Self-pay

## 2021-10-04 ENCOUNTER — Encounter (HOSPITAL_COMMUNITY): Payer: Self-pay

## 2021-10-04 ENCOUNTER — Emergency Department (HOSPITAL_COMMUNITY): Payer: Medicaid Other

## 2021-10-04 DIAGNOSIS — M545 Low back pain, unspecified: Secondary | ICD-10-CM | POA: Insufficient documentation

## 2021-10-04 DIAGNOSIS — Y92410 Unspecified street and highway as the place of occurrence of the external cause: Secondary | ICD-10-CM | POA: Diagnosis not present

## 2021-10-04 DIAGNOSIS — F17210 Nicotine dependence, cigarettes, uncomplicated: Secondary | ICD-10-CM | POA: Insufficient documentation

## 2021-10-04 DIAGNOSIS — Z21 Asymptomatic human immunodeficiency virus [HIV] infection status: Secondary | ICD-10-CM | POA: Insufficient documentation

## 2021-10-04 DIAGNOSIS — M546 Pain in thoracic spine: Secondary | ICD-10-CM | POA: Insufficient documentation

## 2021-10-04 DIAGNOSIS — M7918 Myalgia, other site: Secondary | ICD-10-CM

## 2021-10-04 DIAGNOSIS — R0789 Other chest pain: Secondary | ICD-10-CM | POA: Insufficient documentation

## 2021-10-04 LAB — POC URINE PREG, ED: Preg Test, Ur: NEGATIVE

## 2021-10-04 MED ORDER — IBUPROFEN 600 MG PO TABS: 600.0000 mg | ORAL_TABLET | Freq: Four times a day (QID) | ORAL | 0 refills | Status: AC | PRN

## 2021-10-04 MED ORDER — KETOROLAC TROMETHAMINE 30 MG/ML IJ SOLN
30.0000 mg | Freq: Once | INTRAMUSCULAR | Status: AC
  Administered 2021-10-04: 30 mg via INTRAMUSCULAR
  Filled 2021-10-04: qty 1

## 2021-10-04 MED ORDER — METHOCARBAMOL 500 MG PO TABS: 500.0000 mg | ORAL_TABLET | Freq: Two times a day (BID) | ORAL | 0 refills | Status: DC

## 2021-10-04 NOTE — Discharge Instructions (Addendum)
The pain you are experiencing is likely due to muscle strain, you may take Ibuprofen and Robaxin as needed for pain management. Do not combine with any pain reliever other than tylenol.  Do not drive while taking Robaxin. You may also use ice and heat, and over-the-counter remedies such as Biofreeze gel or salon pas lidocaine patches. The muscle soreness should improve over the next week. Follow up with your family doctor in the next week for a recheck if you are still having symptoms. Return to ED if pain is worsening, you develop weakness or numbness of extremities, or new or concerning symptoms develop.
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
## 2022-03-27 ENCOUNTER — Ambulatory Visit: Payer: Medicaid Other

## 2022-05-06 ENCOUNTER — Ambulatory Visit (HOSPITAL_COMMUNITY)
Admission: EM | Admit: 2022-05-06 | Discharge: 2022-05-06 | Disposition: A | Discharge status: Home / Self Care | Payer: Medicaid Other | Attending: Internal Medicine | Admitting: Internal Medicine

## 2022-05-06 ENCOUNTER — Encounter (HOSPITAL_COMMUNITY): Payer: Self-pay | Admitting: Emergency Medicine

## 2022-05-06 ENCOUNTER — Ambulatory Visit (INDEPENDENT_AMBULATORY_CARE_PROVIDER_SITE_OTHER): Payer: Medicaid Other

## 2022-05-06 ENCOUNTER — Other Ambulatory Visit: Payer: Self-pay

## 2022-05-06 DIAGNOSIS — M5442 Lumbago with sciatica, left side: Secondary | ICD-10-CM | POA: Diagnosis not present

## 2022-05-06 DIAGNOSIS — M545 Low back pain, unspecified: Secondary | ICD-10-CM | POA: Diagnosis not present

## 2022-05-06 DIAGNOSIS — M5441 Lumbago with sciatica, right side: Secondary | ICD-10-CM | POA: Diagnosis not present

## 2022-05-06 MED ORDER — PREDNISONE 20 MG PO TABS: 40.0000 mg | ORAL_TABLET | Freq: Every day | ORAL | 0 refills | Status: AC

## 2022-05-06 MED ORDER — METHOCARBAMOL 500 MG PO TABS: 500.0000 mg | ORAL_TABLET | Freq: Two times a day (BID) | ORAL | 0 refills | Status: AC | PRN

## 2022-05-06 NOTE — Discharge Instructions (Signed)
Your back x-ray is showing some chronic changes.  Suspect that this could be related to your back pain.  You have been prescribed prednisone and muscle relaxer to take for pain.  Please be advised that muscle relaxer can cause drowsiness so do not take with any other sedating medications or while driving.  Please alternate ice and heat to affected areas as well.  Follow-up if symptoms persist or worsen.
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
## 2022-05-06 NOTE — ED Triage Notes (Signed)
Patient reports lower back pain radiating down both legs .  Patient has lidocaine patches, tylenol, advil.  Patient relates pain with raining.  Patient reports a history of this type of pain for 1 year.  Pain has worsened recently.

## 2022-10-31 ENCOUNTER — Encounter (HOSPITAL_COMMUNITY): Payer: Self-pay | Admitting: Obstetrics and Gynecology

## 2022-10-31 ENCOUNTER — Inpatient Hospital Stay (HOSPITAL_COMMUNITY)
Admission: AD | Admit: 2022-10-31 | Discharge: 2022-10-31 | Disposition: A | Discharge status: Home / Self Care | Payer: Medicaid Other | Attending: Obstetrics and Gynecology | Admitting: Obstetrics and Gynecology

## 2022-10-31 DIAGNOSIS — Z3202 Encounter for pregnancy test, result negative: Secondary | ICD-10-CM

## 2022-10-31 LAB — POCT PREGNANCY, URINE: Preg Test, Ur: NEGATIVE
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
## 2022-10-31 NOTE — MAU Note (Signed)
.  Sabrina Galloway is a 37 y.o. at Unknown here in MAU reporting: she thinks she is pregnant and maybe she has an STD. Pt states she feels a heartbeat in her lower stomach and her breasts are hurting. States she keeps vomiting up her "wine and liquor". Some lower abd and lower back pain x 2 months. Complains of yellow discharge with odor LMP: 09/22/2022 Onset of complaint: 2 months ago Pain score: 9/10 abd and 10/10 back Vitals:   10/31/22 1443  BP: (!) 141/83  Pulse: 69  Resp: 19  Temp: 98.2 F (36.8 C)  SpO2: 100%      Lab orders placed from triage:

## 2023-04-19 ENCOUNTER — Emergency Department (HOSPITAL_COMMUNITY)
Admission: EM | Admit: 2023-04-19 | Discharge: 2023-04-19 | Disposition: A | Discharge status: Home / Self Care | Payer: Medicaid Other | Attending: Emergency Medicine | Admitting: Emergency Medicine

## 2023-04-19 ENCOUNTER — Other Ambulatory Visit: Payer: Self-pay

## 2023-04-19 ENCOUNTER — Encounter (HOSPITAL_COMMUNITY): Payer: Self-pay

## 2023-04-19 DIAGNOSIS — N939 Abnormal uterine and vaginal bleeding, unspecified: Secondary | ICD-10-CM | POA: Insufficient documentation

## 2023-04-19 DIAGNOSIS — Z3202 Encounter for pregnancy test, result negative: Secondary | ICD-10-CM | POA: Diagnosis not present

## 2023-04-19 LAB — LIPASE, BLOOD: Lipase: 31 U/L (ref 11–51)

## 2023-04-19 LAB — CBC WITH DIFFERENTIAL/PLATELET
Abs Immature Granulocytes: 0.01 10*3/uL (ref 0.00–0.07)
Basophils Absolute: 0 10*3/uL (ref 0.0–0.1)
Basophils Relative: 0 %
Eosinophils Absolute: 0.1 10*3/uL (ref 0.0–0.5)
Eosinophils Relative: 1 %
HCT: 31.6 % — ABNORMAL LOW (ref 36.0–46.0)
Hemoglobin: 10 g/dL — ABNORMAL LOW (ref 12.0–15.0)
Immature Granulocytes: 0 %
Lymphocytes Relative: 31 %
Lymphs Abs: 2 10*3/uL (ref 0.7–4.0)
MCH: 29.2 pg (ref 26.0–34.0)
MCHC: 31.6 g/dL (ref 30.0–36.0)
MCV: 92.1 fL (ref 80.0–100.0)
Monocytes Absolute: 0.6 10*3/uL (ref 0.1–1.0)
Monocytes Relative: 10 %
Neutro Abs: 3.8 10*3/uL (ref 1.7–7.7)
Neutrophils Relative %: 58 %
Platelets: 262 10*3/uL (ref 150–400)
RBC: 3.43 MIL/uL — ABNORMAL LOW (ref 3.87–5.11)
RDW: 14.9 % (ref 11.5–15.5)
WBC: 6.5 10*3/uL (ref 4.0–10.5)
nRBC: 0 % (ref 0.0–0.2)

## 2023-04-19 LAB — COMPREHENSIVE METABOLIC PANEL
ALT: 25 U/L (ref 0–44)
AST: 25 U/L (ref 15–41)
Albumin: 3.5 g/dL (ref 3.5–5.0)
Alkaline Phosphatase: 53 U/L (ref 38–126)
Anion gap: 8 (ref 5–15)
BUN: 12 mg/dL (ref 6–20)
CO2: 22 mmol/L (ref 22–32)
Calcium: 8.2 mg/dL — ABNORMAL LOW (ref 8.9–10.3)
Chloride: 105 mmol/L (ref 98–111)
Creatinine, Ser: 0.91 mg/dL (ref 0.44–1.00)
GFR, Estimated: >60 mL/min (ref >60)
Glucose, Bld: 93 mg/dL (ref 70–99)
Potassium: 3.8 mmol/L (ref 3.5–5.1)
Sodium: 135 mmol/L (ref 135–145)
Total Bilirubin: 0.4 mg/dL (ref 0.3–1.2)
Total Protein: 6.5 g/dL (ref 6.5–8.1)

## 2023-04-19 LAB — HCG, QUANTITATIVE, PREGNANCY: hCG, Beta Chain, Quant, S: 1 m[IU]/mL (ref <5)

## 2023-04-19 MED ORDER — ACETAMINOPHEN 500 MG PO TABS
1000.0000 mg | ORAL_TABLET | Freq: Once | ORAL | Status: AC
  Administered 2023-04-19: 1000 mg via ORAL
  Filled 2023-04-19: qty 2

## 2023-04-19 NOTE — Discharge Instructions (Signed)
Your pregnancy test today was negative. You can follow-up with OB-GYN if additional concerns. Return here as needed.

## 2023-04-19 NOTE — ED Provider Notes (Signed)
[place] EMERGENCY DEPARTMENT AT [facility] Provider Note   CSN: [id number] Arrival date & time: 04/19/23  0255     History  Chief Complaint  Patient presents with   Vaginal Bleeding    Arrived via ems from hotel stating she has had abnormal vag bleeding since sex yesterday and now has cramps and abd paain as well CBG 104 VS 116/78 87 99% 20    Sabrina Galloway is a 37 y.o. female.  The history is provided by the patient and medical records.  Vaginal Bleeding Associated symptoms: abdominal pain    37 year old female with history of schizoaffective disorder, bipolar disorder, presenting to the ED stating "my whole body hurts, I am pregnant".  States she has had 2 negative pregnancy test but 1 positive last month.  States now she is having bilateral breast discomfort, feet are swelling, she is eating "an enormous amount" she just overall does not feel well.  She denies any urinary symptoms.  No nausea or vomiting.  Triage note indicates vaginal bleeding, however denies this to me.  She does admit to some abdominal cramping.  Home Medications Prior to Admission medications   Medication Sig Start Date End Date Taking? Authorizing Provider  ARIPiprazole (ABILIFY) 5 MG tablet Take 1 tablet (5 mg total) by mouth daily. Patient not taking: Reported on 05/06/2022 03/29/18   Maryagnes Amos, FNP  benztropine (COGENTIN) 1 MG tablet Take 1 tablet (1 mg total) by mouth daily. Patient not taking: Reported on 05/06/2022 03/29/18   Maryagnes Amos, FNP  emtricitabine-tenofovir (TRUVADA) 200-300 MG tablet Take 1 tablet by mouth daily. Patient not taking: Reported on 05/06/2022 08/13/18   Kuppelweiser, Cassie L, RPH-CPP  FLUOXETINE HCL PO Take by mouth. Patient not taking: Reported on 05/06/2022    [provider]  hydrOXYzine (ATARAX/VISTARIL) 25 MG tablet Take 1 tablet (25 mg total) by mouth 3 (three) times daily as needed for anxiety (sleep). Patient not taking:  Reported on 05/06/2022 03/29/18   Maryagnes Amos, FNP  ibuprofen (ADVIL) 600 MG tablet Take 1 tablet (600 mg total) by mouth every 6 (six) hours as needed. 10/04/21   Dartha Lodge, PA-C  methocarbamol (ROBAXIN) 500 MG tablet Take 1 tablet (500 mg total) by mouth 2 (two) times daily as needed for muscle spasms. 05/06/22   Gustavus Bryant, FNP      Allergies    Vicodin [hydrocodone-acetaminophen]    Review of Systems   Review of Systems  Gastrointestinal:  Positive for abdominal pain.  All other systems reviewed and are negative.   Physical Exam Updated Vital Signs BP 130/72 (BP Location: Right Arm)   Pulse 74   Temp 98.8 F (37.1 C) (Oral)   Resp 16   Ht 5' (1.524 m)   Wt 102.1 kg   SpO2 100%   BMI 43.94 kg/m   Physical Exam Vitals and nursing note reviewed.  Constitutional:      Appearance: She is well-developed.  HENT:     Head: Normocephalic and atraumatic.  Eyes:     Conjunctiva/sclera: Conjunctivae normal.     Pupils: Pupils are equal, round, and reactive to light.  Cardiovascular:     Rate and Rhythm: Normal rate and regular rhythm.     Heart sounds: Normal heart sounds.  Pulmonary:     Effort: Pulmonary effort is normal.     Breath sounds: Normal breath sounds.  Abdominal:     General: Bowel sounds are normal.  Palpations: Abdomen is soft.  Musculoskeletal:        General: Normal range of motion.     Cervical back: Normal range of motion.  Skin:    General: Skin is warm and dry.  Neurological:     Mental Status: She is alert and oriented to person, place, and time.     ED Results / Procedures / Treatments   Labs (all labs ordered are listed, but only abnormal results are displayed) Labs Reviewed  CBC WITH DIFFERENTIAL/PLATELET - Abnormal; Notable for the following components:      Result Value   RBC 3.43 (*)    Hemoglobin 10.0 (*)    HCT 31.6 (*)    All other components within normal limits  COMPREHENSIVE METABOLIC PANEL - Abnormal;  Notable for the following components:   Calcium 8.2 (*)    All other components within normal limits  LIPASE, BLOOD  HCG, QUANTITATIVE, PREGNANCY    EKG None  Radiology No results found.  Procedures Procedures    Medications Ordered in ED Medications - No data to display  ED Course/ Medical Decision Making/ A&P                             Medical Decision Making Amount and/or Complexity of Data Reviewed Labs: ordered. ECG/medicine tests: ordered and independent interpretation performed.   37 year old female presenting to the ED with pregnancy concerns.  States she had positive test last month and has been eating more than usual, breast tenderness, and foot swelling.  She denies any vomiting.  Triage note indicates vaginal bleeding, however denies this to me.  Does admit to some abdominal cramping.  She has no elicited tenderness on exam.  Labs as above--she has no leukocytosis or electrolyte derangement.  Her hCG is negative.  Results discussed with patient, she insist that "the test is wrong".  It appears that she presented to MAU in January with very similar complaints, negative pregnancy test at that time as well.  I recommended that she follow-up with OB/GYN if she has additional concerns.  She can return here as needed.  Final Clinical Impression(s) / ED Diagnoses Final diagnoses:  Negative pregnancy test    Rx / DC Orders ED Discharge Orders     None         Garlon Hatchet, PA-C 04/19/23 0503    Sabas Sous, MD 04/19/23 [phone]
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
## 2023-10-03 ENCOUNTER — Other Ambulatory Visit: Payer: Self-pay

## 2023-10-03 ENCOUNTER — Emergency Department (HOSPITAL_COMMUNITY)
Admission: EM | Admit: 2023-10-03 | Discharge: 2023-10-03 | Disposition: A | Discharge status: Home / Self Care | Payer: Medicaid Other | Attending: Emergency Medicine | Admitting: Emergency Medicine

## 2023-10-03 ENCOUNTER — Inpatient Hospital Stay (HOSPITAL_COMMUNITY)
Admission: AD | Admit: 2023-10-03 | Discharge: 2023-10-03 | Discharge status: Against Medical Advice | Payer: Medicaid Other | Source: Home / Self Care | Attending: Obstetrics and Gynecology | Admitting: Obstetrics and Gynecology

## 2023-10-03 ENCOUNTER — Encounter (HOSPITAL_COMMUNITY): Payer: Self-pay

## 2023-10-03 DIAGNOSIS — R109 Unspecified abdominal pain: Secondary | ICD-10-CM | POA: Insufficient documentation

## 2023-10-03 DIAGNOSIS — M545 Low back pain, unspecified: Secondary | ICD-10-CM | POA: Insufficient documentation

## 2023-10-03 LAB — URINALYSIS, ROUTINE W REFLEX MICROSCOPIC
Bilirubin Urine: NEGATIVE
Glucose, UA: NEGATIVE mg/dL
Hgb urine dipstick: NEGATIVE
Ketones, ur: 20 mg/dL — AB
Leukocytes,Ua: NEGATIVE
Nitrite: NEGATIVE
Protein, ur: NEGATIVE mg/dL
Specific Gravity, Urine: 1.03 (ref 1.005–1.030)
pH: 5 (ref 5.0–8.0)

## 2023-10-03 LAB — CBC WITH DIFFERENTIAL/PLATELET
Abs Immature Granulocytes: 0.02 10*3/uL (ref 0.00–0.07)
Basophils Absolute: 0 10*3/uL (ref 0.0–0.1)
Basophils Relative: 0 %
Eosinophils Absolute: 0.1 10*3/uL (ref 0.0–0.5)
Eosinophils Relative: 2 %
HCT: 32.4 % — ABNORMAL LOW (ref 36.0–46.0)
Hemoglobin: 10.5 g/dL — ABNORMAL LOW (ref 12.0–15.0)
Immature Granulocytes: 0 %
Lymphocytes Relative: 29 %
Lymphs Abs: 1.7 10*3/uL (ref 0.7–4.0)
MCH: 30.2 pg (ref 26.0–34.0)
MCHC: 32.4 g/dL (ref 30.0–36.0)
MCV: 93.1 fL (ref 80.0–100.0)
Monocytes Absolute: 0.7 10*3/uL (ref 0.1–1.0)
Monocytes Relative: 13 %
Neutro Abs: 3.2 10*3/uL (ref 1.7–7.7)
Neutrophils Relative %: 56 %
Platelets: 258 10*3/uL (ref 150–400)
RBC: 3.48 MIL/uL — ABNORMAL LOW (ref 3.87–5.11)
RDW: 14.8 % (ref 11.5–15.5)
WBC: 5.8 10*3/uL (ref 4.0–10.5)
nRBC: 0 % (ref 0.0–0.2)

## 2023-10-03 LAB — RAPID URINE DRUG SCREEN, HOSP PERFORMED
Amphetamines: NOT DETECTED
Barbiturates: NOT DETECTED
Benzodiazepines: NOT DETECTED
Cocaine: NOT DETECTED
Opiates: NOT DETECTED
Tetrahydrocannabinol: POSITIVE — AB

## 2023-10-03 LAB — COMPREHENSIVE METABOLIC PANEL
ALT: 22 U/L (ref 0–44)
AST: 39 U/L (ref 15–41)
Albumin: 3.6 g/dL (ref 3.5–5.0)
Alkaline Phosphatase: 54 U/L (ref 38–126)
Anion gap: 6 (ref 5–15)
BUN: 12 mg/dL (ref 6–20)
CO2: 24 mmol/L (ref 22–32)
Calcium: 8.7 mg/dL — ABNORMAL LOW (ref 8.9–10.3)
Chloride: 105 mmol/L (ref 98–111)
Creatinine, Ser: 0.66 mg/dL (ref 0.44–1.00)
GFR, Estimated: >60 mL/min (ref >60)
Glucose, Bld: 80 mg/dL (ref 70–99)
Potassium: 3.4 mmol/L — ABNORMAL LOW (ref 3.5–5.1)
Sodium: 135 mmol/L (ref 135–145)
Total Bilirubin: 0.5 mg/dL (ref <1.2)
Total Protein: 6.6 g/dL (ref 6.5–8.1)

## 2023-10-03 LAB — HCG, QUANTITATIVE, PREGNANCY: hCG, Beta Chain, Quant, S: <1 m[IU]/mL (ref <5)

## 2023-10-03 LAB — ETHANOL: Alcohol, Ethyl (B): <10 mg/dL (ref <10)

## 2023-10-03 MED ORDER — NAPROXEN 500 MG PO TABS
500.0000 mg | ORAL_TABLET | Freq: Two times a day (BID) | ORAL | 0 refills | Status: AC
Start: 2023-10-03 — End: 2023-11-02

## 2023-10-03 MED ORDER — NAPROXEN 250 MG PO TABS
500.0000 mg | ORAL_TABLET | Freq: Once | ORAL | Status: AC
  Administered 2023-10-03: 500 mg via ORAL
  Filled 2023-10-03: qty 2

## 2023-10-03 MED ORDER — LIDOCAINE 5 % EX PTCH
1.0000 | MEDICATED_PATCH | CUTANEOUS | Status: DC
  Administered 2023-10-03: 1 via TRANSDERMAL
  Filled 2023-10-03: qty 1

## 2023-10-03 NOTE — ED Notes (Signed)
Patient encouraged to give UA but fell back asleep.

## 2023-10-03 NOTE — ED Provider Notes (Signed)
[place] EMERGENCY DEPARTMENT AT [facility] Provider Note   CSN: [id number] Arrival date & time: 10/03/23  4098     History  Chief Complaint  Patient presents with   Back Pain    Sabrina Galloway is a 37 y.o. female with a history of schizoaffective disorder who presents the ED today via EMS for back pain.  Patient reports that she was walking yesterday when she developed low back pain that radiates down both legs.  She states the pain has been persistent since then, but has not taken anything for her symptoms.  Additionally, patient reports that she is between 6 to 8 months pregnant and believes she is in active labor.  She has been seen multiple times for this same complaint.  Per EMS, her last menstrual cycle ended 3 days ago. She endorses generalized abdominal pain but denies fevers, nausea, vomiting, dysuria, or changes to bowel habits.    Home Medications Prior to Admission medications   Medication Sig Start Date End Date Taking? Authorizing Provider  naproxen (NAPROSYN) 500 MG tablet Take 1 tablet (500 mg total) by mouth 2 (two) times daily. 10/03/23 11/02/23 Yes Maxwell Marion, PA-C  ARIPiprazole (ABILIFY) 5 MG tablet Take 1 tablet (5 mg total) by mouth daily. Patient not taking: Reported on 05/06/2022 03/29/18   Maryagnes Amos, FNP  benztropine (COGENTIN) 1 MG tablet Take 1 tablet (1 mg total) by mouth daily. Patient not taking: Reported on 05/06/2022 03/29/18   Maryagnes Amos, FNP  emtricitabine-tenofovir (TRUVADA) 200-300 MG tablet Take 1 tablet by mouth daily. Patient not taking: Reported on 05/06/2022 08/13/18   Kuppelweiser, Cassie L, RPH-CPP  FLUOXETINE HCL PO Take by mouth. Patient not taking: Reported on 05/06/2022    [provider]  hydrOXYzine (ATARAX/VISTARIL) 25 MG tablet Take 1 tablet (25 mg total) by mouth 3 (three) times daily as needed for anxiety (sleep). Patient not taking: Reported on 05/06/2022 03/29/18   Maryagnes Amos,  FNP  ibuprofen (ADVIL) 600 MG tablet Take 1 tablet (600 mg total) by mouth every 6 (six) hours as needed. 10/04/21   Rosezella Rumpf, PA-C  methocarbamol (ROBAXIN) 500 MG tablet Take 1 tablet (500 mg total) by mouth 2 (two) times daily as needed for muscle spasms. 05/06/22   Gustavus Bryant, FNP      Allergies    Vicodin [hydrocodone-acetaminophen]    Review of Systems   Review of Systems  Musculoskeletal:  Positive for back pain.  All other systems reviewed and are negative.   Physical Exam Updated Vital Signs BP (!) 106/58 (BP Location: Right Arm)   Pulse 76   Temp 98.5 F (36.9 C) (Oral)   Resp 17   SpO2 100%  Physical Exam Vitals and nursing note reviewed.  Constitutional:      General: She is not in acute distress.    Appearance: Normal appearance.  HENT:     Head: Normocephalic and atraumatic.     Mouth/Throat:     Mouth: Mucous membranes are moist.  Eyes:     Conjunctiva/sclera: Conjunctivae normal.     Pupils: Pupils are equal, round, and reactive to light.  Cardiovascular:     Rate and Rhythm: Normal rate and regular rhythm.     Pulses: Normal pulses.     Heart sounds: Normal heart sounds.  Pulmonary:     Effort: Pulmonary effort is normal.     Breath sounds: Normal breath sounds.  Abdominal:     Palpations: Abdomen  is soft.     Tenderness: There is no abdominal tenderness.  Musculoskeletal:        General: Tenderness present. Normal range of motion.     Right lower leg: No edema.     Left lower leg: No edema.     Comments: Midline tenderness to the lumbar spine as well as associated paravertebral tenderness. No obvious step-off or deformity appreciated. Strength, sensation, and ROM of the lower extremities appreciated bilaterally.  Skin:    General: Skin is warm and dry.     Findings: No rash.  Neurological:     General: No focal deficit present.     Mental Status: She is alert.     Sensory: No sensory deficit.     Motor: No weakness.  Psychiatric:         Mood and Affect: Mood normal.        Behavior: Behavior normal.    ED Results / Procedures / Treatments   Labs (all labs ordered are listed, but only abnormal results are displayed) Labs Reviewed  COMPREHENSIVE METABOLIC PANEL - Abnormal; Notable for the following components:      Result Value   Potassium 3.4 (*)    Calcium 8.7 (*)    All other components within normal limits  RAPID URINE DRUG SCREEN, HOSP PERFORMED - Abnormal; Notable for the following components:   Tetrahydrocannabinol POSITIVE (*)    All other components within normal limits  CBC WITH DIFFERENTIAL/PLATELET - Abnormal; Notable for the following components:   RBC 3.48 (*)    Hemoglobin 10.5 (*)    HCT 32.4 (*)    All other components within normal limits  URINALYSIS, ROUTINE W REFLEX MICROSCOPIC - Abnormal; Notable for the following components:   APPearance HAZY (*)    Ketones, ur 20 (*)    All other components within normal limits  ETHANOL  HCG, QUANTITATIVE, PREGNANCY    EKG None  Radiology No results found.  Procedures Procedures: not indicated.   Medications Ordered in ED Medications  lidocaine (LIDODERM) 5 % 1 patch (1 patch Transdermal Patch Applied 10/03/23 1054)  naproxen (NAPROSYN) tablet 500 mg (500 mg Oral Given 10/03/23 1521)    ED Course/ Medical Decision Making/ A&P                                 Medical Decision Making Amount and/or Complexity of Data Reviewed Labs: ordered.  Risk Prescription drug management.   This patient presents to the ED for concern of back pain, this involves an extensive number of treatment options, and is a complaint that carries with it a high risk of complications and morbidity.   Differential diagnosis includes: muscle strain, spasm, sciatica, contusion, etc.   Comorbidities  See HPI above   Additional History  Additional history obtained from prior ED and MAU records.   Lab Tests  I ordered and personally interpreted labs.   The pertinent results include:   Quantitative hCG is < 1 CMP and CBC are within normal limits - no acute electrolyte derangement, transaminitis, AKI, infection, or anemia Ethanol level is < 10 UDS positive for marijuana UA shows elevated ketones otherwise WNL   Problem List / ED Course / Critical Interventions / Medication Management  Low back pain after walking Since there was no trauma or injury to the spine directly, I did not order imaging. Patient has a history of degenerative changes to the lumbar spine  based off of prior imaging. I ordered medications including: Lidocaine patch for back pain  Reevaluation of the patient after these medicines showed that the patient improved. On reevaluation, patient reports that her back pain has returned and radiates to her groin.  Naproxen given prior to discharge. I have reviewed the patients home medicines and have made adjustments as needed   Social Determinants of Health  Access to healthcare   Test / Admission - Considered  Discussed labs with patient.  All questions were answered. She is stable and safe for discharge home. Return precautions provided.       Final Clinical Impression(s) / ED Diagnoses Final diagnoses:  Acute bilateral low back pain, unspecified whether sciatica present    Rx / DC Orders ED Discharge Orders          Ordered    naproxen (NAPROSYN) 500 MG tablet  2 times daily        10/03/23 1454              Maxwell Marion, PA-C 10/03/23 1545    Margarita Grizzle, MD 10/03/23 1547

## 2023-10-03 NOTE — MAU Note (Signed)
Sabrina Galloway is a 37 y.o. at Unknown here in MAU reporting: is going into labor.  Pain is in her abd and back. That is why she came to the ER this morning.  Saw she had a neg preg test this morning in the ER.  pt started yelling and cussing.  "Was at the [facility] and the pain started.  It hurts so bad.You can look at my stomach and see I am pregnant" Onset of complaint: last night Pain score: 10 Vitals:   10/03/23 1642  BP: 116/85  Pulse: 76  Resp: 20  Temp: 98.3 F (36.8 C)  SpO2: 100%     FAO:ZHYQMVHQ I listen because there is a baby in there.  NO heart tones heard Once pt calmed down. - abd is soft on palpation. No palpable uterus or fetus noted. Lab orders placed from triage:

## 2023-10-03 NOTE — ED Triage Notes (Signed)
[place], patient states she just got kicked out of her hotel, patient was found holding up her shirt and stating she is pregnant and in labor. Patients last menstrual was 3 days ago per EMS,. Patient now complaining of bilat leg pain and back pain x1 day.

## 2023-10-03 NOTE — ED Notes (Signed)
PT was disgruntled about being discharged, After I took her IV out, PT stated." Dont worry , yall gonna lose yall life">PT continued to talk to herself as she was walking out.

## 2023-10-03 NOTE — MAU Note (Signed)
Pt threatening both Dr Lucianne Muss and I both ...Marland Kitchen"If I deliver these babies in the street, he is going to be coming for you, you all are just stupid. "  When I said "Wai, that is a threat".  She answered, "yes , it is.  My daddy is on a plane and he is going to be coming for you."  She then said, "I am leaving, you all don't know what you are doing, you are just stupid.  If I go out there and something happens, you all are going to pay".

## 2023-10-03 NOTE — MAU Provider Note (Signed)
Event Date/Time   First Provider Initiated Contact with Patient 10/03/23 1648      S Ms. SHAQUAYA KNISLEY is a 37 y.o. No obstetric history on file. patient who presents to MAU today with complaint of labor. Patient arrived at MAU front desk stating that she was sent over from ER for labor. On review of notes, pt presented to [facility] ER earlier today for back and abdominal pain, which she reported was due to active labor and reportedly was 6-8 months pregnant. W/up in ER revealed CMP, CBC wnl, neg EtOH level, UDS pos for THC, b-hCG <1. On chart review, pt discharged from the ER.   I evaluated patient in triage room. She is laying on bed, crying out. I inquired about her recent ER visit. She states ER told her she was not pregnant. She repeatedly is asking "then what is wrong with me?" But is unable to elaborate on her symptoms. She used several expletives as she expressed frustration that we were unable to help her. I discussed with a b-hCG of <1, pregnancy unlikely to be cause of her symptoms and I would recommend further f/up with PCP and return to ER if symptoms are to worsen.   At this point, pt stood up from chair, called myself and Jolynn Psychologist, occupational) "slow" and "stupid" and stated she was going to leave and her father was going to come pick her up. On her way out the door, she stated "If I deliver these babies in the street, he is going to be coming for you, you all are just stupid." Pt ambulated with a steady gait out of MAU.   O BP 116/85 (BP Location: Right Arm)   Pulse 76   Temp 98.3 F (36.8 C) (Oral)   Resp 20   Ht 5' (1.524 m)   Wt 66.2 kg   SpO2 100%   BMI 28.49 kg/m  Physical Exam Constitutional:      Appearance: She is not toxic-appearing.  HENT:     Head: Normocephalic and atraumatic.  Cardiovascular:     Rate and Rhythm: Normal rate.  Pulmonary:     Effort: Pulmonary effort is normal.  Abdominal:     General: Abdomen is flat. There is no distension.     Palpations:  Abdomen is soft.     Tenderness: There is no abdominal tenderness. There is no guarding or rebound.  Neurological:     Mental Status: She is alert.     A/P Medical screening exam complete Abdominal and back pain: Pt presenting after being discharged from [facility] with complaint of labor pains. ER evaluation revealed b-hCG of <1, she is in no acute distress and abdomen is soft, non-gravid, and non-tender on my exam. Jolynn, RN, attempted to obtain FHR to reassure pt that she is not pregnant and did not obtain heart tones. Pt insistent that she is pregnant and we are not treating her appropriately. I discussed ER lab findings and negative serum hCG with pt and recommended she f/up with PCP or return to ER if symptoms worsen in any way. Unfortunately, we do not have much to offer her from an OB standpoint in light of negative serum pregnancy test. Pt became frustrated and left MAU with steady gait.    Patient given the option of transfer to [facility] for further evaluation or seek care in outpatient facility of choice   Sundra Aland, MD 10/03/2023 4:55 PM

## 2023-10-03 NOTE — Discharge Instructions (Addendum)
As discussed, your results are within normal limits.  I have sent a prescription naproxen to your pharmacy.  You can take this up to twice a day as needed.  You can can also get lidocaine patches over-the-counter.  Follow-up with [facility] for reevaluation.  Follow-up with your primary care provider in the next 5 days for reevaluation of your symptoms.  If you do not have one, there is a number in the discharge paperwork and call that we will help you get established with one.  Turn to the ED if your symptoms worsen in the interim.

## 2023-11-22 ENCOUNTER — Emergency Department (HOSPITAL_COMMUNITY): Payer: Medicaid Other

## 2023-11-22 ENCOUNTER — Encounter (HOSPITAL_COMMUNITY): Payer: Self-pay | Admitting: Emergency Medicine

## 2023-11-22 ENCOUNTER — Emergency Department (HOSPITAL_COMMUNITY)
Admission: EM | Admit: 2023-11-22 | Discharge: 2023-11-23 | Disposition: A | Discharge status: Home / Self Care | Payer: Medicaid Other | Attending: Emergency Medicine | Admitting: Emergency Medicine

## 2023-11-22 ENCOUNTER — Other Ambulatory Visit: Payer: Self-pay

## 2023-11-22 DIAGNOSIS — R1084 Generalized abdominal pain: Secondary | ICD-10-CM | POA: Diagnosis present

## 2023-11-22 LAB — COMPREHENSIVE METABOLIC PANEL
ALT: 17 U/L (ref 0–44)
AST: 20 U/L (ref 15–41)
Albumin: 3.9 g/dL (ref 3.5–5.0)
Alkaline Phosphatase: 47 U/L (ref 38–126)
Anion gap: 8 (ref 5–15)
BUN: 15 mg/dL (ref 6–20)
CO2: 24 mmol/L (ref 22–32)
Calcium: 8.6 mg/dL — ABNORMAL LOW (ref 8.9–10.3)
Chloride: 110 mmol/L (ref 98–111)
Creatinine, Ser: 0.45 mg/dL (ref 0.44–1.00)
GFR, Estimated: >60 mL/min (ref >60)
Glucose, Bld: 86 mg/dL (ref 70–99)
Potassium: 4 mmol/L (ref 3.5–5.1)
Sodium: 142 mmol/L (ref 135–145)
Total Bilirubin: 0.6 mg/dL (ref 0.0–1.2)
Total Protein: 6.9 g/dL (ref 6.5–8.1)

## 2023-11-22 LAB — CBC WITH DIFFERENTIAL/PLATELET
Abs Immature Granulocytes: 0 10*3/uL (ref 0.00–0.07)
Basophils Absolute: 0.1 10*3/uL (ref 0.0–0.1)
Basophils Relative: 2 %
Eosinophils Absolute: 0.2 10*3/uL (ref 0.0–0.5)
Eosinophils Relative: 4 %
HCT: 32.8 % — ABNORMAL LOW (ref 36.0–46.0)
Hemoglobin: 10.7 g/dL — ABNORMAL LOW (ref 12.0–15.0)
Lymphocytes Relative: 63 %
Lymphs Abs: 2.8 10*3/uL (ref 0.7–4.0)
MCH: 31.3 pg (ref 26.0–34.0)
MCHC: 32.6 g/dL (ref 30.0–36.0)
MCV: 95.9 fL (ref 80.0–100.0)
Monocytes Absolute: 0 10*3/uL — ABNORMAL LOW (ref 0.1–1.0)
Monocytes Relative: 1 %
Neutro Abs: 1.3 10*3/uL — ABNORMAL LOW (ref 1.7–7.7)
Neutrophils Relative %: 30 %
Platelets: 265 10*3/uL (ref 150–400)
RBC: 3.42 MIL/uL — ABNORMAL LOW (ref 3.87–5.11)
RDW: 14.5 % (ref 11.5–15.5)
WBC: 4.4 10*3/uL (ref 4.0–10.5)
nRBC: 0 % (ref 0.0–0.2)

## 2023-11-22 LAB — HCG, QUANTITATIVE, PREGNANCY: hCG, Beta Chain, Quant, S: <1 m[IU]/mL (ref <5)

## 2023-11-22 LAB — LIPASE, BLOOD: Lipase: 34 U/L (ref 11–51)

## 2023-11-22 MED ORDER — KETOROLAC TROMETHAMINE 30 MG/ML IJ SOLN
30.0000 mg | Freq: Once | INTRAMUSCULAR | Status: AC
Start: 2023-11-22 — End: 2023-11-22
  Administered 2023-11-22: 30 mg via INTRAVENOUS
  Filled 2023-11-22: qty 1

## 2023-11-22 MED ORDER — IOHEXOL 300 MG/ML  SOLN
100.0000 mL | Freq: Once | INTRAMUSCULAR | Status: AC | PRN
  Administered 2023-11-22: 100 mL via INTRAVENOUS

## 2023-11-22 NOTE — ED Triage Notes (Signed)
Patient c/o abdominal pain x 20 mins. Patient denies N/V. Patient denies fever.  BP 126/78 HR 100 RR 18 O2sat 100%  CBG 126

## 2023-11-22 NOTE — ED Provider Triage Note (Addendum)
Emergency Medicine Provider Triage Evaluation Note  Sabrina Galloway , a 38 y.o. female  was evaluated in triage.  Pt complains of lower abdominal pain that also radiates into her low back.  Reports this came on when she was [place] the park "earlier this morning", several hours ago.  Denies any nausea or vomiting.  Denies any fever or chills.  Denies any dysuria, hematuria, increased frequency.  Review of Systems  Positive: As above Negative: As above  Physical Exam  BP 113/67   Pulse 89   Temp 98 F (36.7 C)   Resp 16   Ht 5' (1.524 m)   Wt 66 kg   SpO2 99%   BMI 28.42 kg/m  Gen:   Awake, no distress but looks to be in pain   Resp:  Normal effort  MSK:   Moves extremities without difficulty   Abdomen soft and nontender to palpation, no rebound or guarding Medical Decision Making  Medically screening exam initiated at 8:55 PM.  Appropriate orders placed.  Sabrina Galloway was informed that the remainder of the evaluation will be completed by another provider, this initial triage assessment does not replace that evaluation, and the importance of remaining in the ED until their evaluation is complete.     Arabella Merles, PA-C 11/22/23 2056    Arabella Merles, PA-C 11/22/23 2056

## 2023-11-23 NOTE — ED Provider Notes (Addendum)
[place] EMERGENCY DEPARTMENT AT [facility] Provider Note   CSN: [id number] Arrival date & time: 11/22/23  2014     History  Chief Complaint  Patient presents with   Abdominal Pain    Sabrina Galloway is a 38 y.o. female.  Patient came to the emergency department by ambulance for abdominal pain.  Patient reportedly started about 20 minutes before arrival.  Patient's workup was initiated through triage.  Upon my evaluation in the exam room, she has already had her CT scan.  At this point, pain is resolved.       Home Medications Prior to Admission medications   Medication Sig Start Date End Date Taking? Authorizing Provider  ARIPiprazole (ABILIFY) 5 MG tablet Take 1 tablet (5 mg total) by mouth daily. Patient not taking: Reported on 05/06/2022 03/29/18   Maryagnes Amos, FNP  benztropine (COGENTIN) 1 MG tablet Take 1 tablet (1 mg total) by mouth daily. Patient not taking: Reported on 05/06/2022 03/29/18   Maryagnes Amos, FNP  emtricitabine-tenofovir (TRUVADA) 200-300 MG tablet Take 1 tablet by mouth daily. Patient not taking: Reported on 05/06/2022 08/13/18   Kuppelweiser, Cassie L, RPH-CPP  FLUOXETINE HCL PO Take by mouth. Patient not taking: Reported on 05/06/2022    [provider]  hydrOXYzine (ATARAX/VISTARIL) 25 MG tablet Take 1 tablet (25 mg total) by mouth 3 (three) times daily as needed for anxiety (sleep). Patient not taking: Reported on 05/06/2022 03/29/18   Maryagnes Amos, FNP  ibuprofen (ADVIL) 600 MG tablet Take 1 tablet (600 mg total) by mouth every 6 (six) hours as needed. 10/04/21   Rosezella Rumpf, PA-C  methocarbamol (ROBAXIN) 500 MG tablet Take 1 tablet (500 mg total) by mouth 2 (two) times daily as needed for muscle spasms. 05/06/22   Gustavus Bryant, FNP      Allergies    Vicodin [hydrocodone-acetaminophen]    Review of Systems   Review of Systems  Physical Exam Updated Vital Signs BP 115/72 (BP Location: Left Arm)    Pulse 78   Temp 97.7 F (36.5 C) (Oral)   Resp 17   Ht 5' (1.524 m)   Wt 66 kg   SpO2 100%   BMI 28.42 kg/m  Physical Exam Vitals and nursing note reviewed.  Constitutional:      General: She is not in acute distress.    Appearance: She is well-developed.  HENT:     Head: Normocephalic and atraumatic.     Mouth/Throat:     Mouth: Mucous membranes are moist.  Eyes:     General: Vision grossly intact. Gaze aligned appropriately.     Extraocular Movements: Extraocular movements intact.     Conjunctiva/sclera: Conjunctivae normal.  Cardiovascular:     Rate and Rhythm: Normal rate and regular rhythm.     Pulses: Normal pulses.     Heart sounds: Normal heart sounds, S1 normal and S2 normal. No murmur heard.    No friction rub. No gallop.  Pulmonary:     Effort: Pulmonary effort is normal. No respiratory distress.     Breath sounds: Normal breath sounds.  Abdominal:     General: Bowel sounds are normal.     Palpations: Abdomen is soft.     Tenderness: There is no abdominal tenderness. There is no guarding or rebound.     Hernia: No hernia is present.  Musculoskeletal:        General: No swelling.     Cervical back: Full passive  range of motion without pain, normal range of motion and neck supple. No spinous process tenderness or muscular tenderness. Normal range of motion.     Right lower leg: No edema.     Left lower leg: No edema.  Skin:    General: Skin is warm and dry.     Capillary Refill: Capillary refill takes less than 2 seconds.     Findings: No ecchymosis, erythema, rash or wound.  Neurological:     General: No focal deficit present.     Mental Status: She is alert and oriented to person, place, and time.     GCS: GCS eye subscore is 4. GCS verbal subscore is 5. GCS motor subscore is 6.     Cranial Nerves: Cranial nerves 2-12 are intact.     Sensory: Sensation is intact.     Motor: Motor function is intact.     Coordination: Coordination is intact.   Psychiatric:        Attention and Perception: Attention normal.        Mood and Affect: Mood normal.        Speech: Speech normal.        Behavior: Behavior normal.     ED Results / Procedures / Treatments   Labs (all labs ordered are listed, but only abnormal results are displayed) Labs Reviewed  CBC WITH DIFFERENTIAL/PLATELET - Abnormal; Notable for the following components:      Result Value   RBC 3.42 (*)    Hemoglobin 10.7 (*)    HCT 32.8 (*)    Neutro Abs 1.3 (*)    Monocytes Absolute 0.0 (*)    All other components within normal limits  COMPREHENSIVE METABOLIC PANEL - Abnormal; Notable for the following components:   Calcium 8.6 (*)    All other components within normal limits  LIPASE, BLOOD  HCG, QUANTITATIVE, PREGNANCY  URINALYSIS, ROUTINE W REFLEX MICROSCOPIC    EKG None  Radiology CT ABDOMEN PELVIS W CONTRAST Result Date: 11/22/2023 CLINICAL DATA:  Lower abdominal pain EXAM: CT ABDOMEN AND PELVIS WITH CONTRAST TECHNIQUE: Multidetector CT imaging of the abdomen and pelvis was performed using the standard protocol following bolus administration of intravenous contrast. RADIATION DOSE REDUCTION: This exam was performed according to the departmental dose-optimization program which includes automated exposure control, adjustment of the mA and/or kV according to patient size and/or use of iterative reconstruction technique. CONTRAST:  OMNIPAQUE IOHEXOL 300 MG/ML  SOLN COMPARISON:  None Available. FINDINGS: Lower chest: No acute abnormality. Hepatobiliary: The liver is mildly enlarged. Gallbladder and bile ducts are within normal limits. Pancreas: Unremarkable. No pancreatic ductal dilatation or surrounding inflammatory changes. Spleen: Normal in size without focal abnormality. Adrenals/Urinary Tract: There some artifact from patient's arm positioning. The kidneys, adrenal glands and bladder appear within normal limits. Stomach/Bowel: Stomach is within normal limits.  Appendix appears normal. No evidence of bowel wall thickening, distention, or inflammatory changes. Vascular/Lymphatic: No significant vascular findings are present. No enlarged abdominal or pelvic lymph nodes. Reproductive: Uterus and bilateral adnexa are unremarkable. Other: No abdominal wall hernia or abnormality. No abdominopelvic ascites. Musculoskeletal: No acute or significant osseous findings. IMPRESSION: 1. No acute localizing process in the abdomen or pelvis. 2. Mild hepatomegaly. Electronically Signed   By: Darliss Cheney M.D.   On: 11/22/2023 23:40   US Pelvis Complete Result Date: 11/22/2023 CLINICAL DATA:  Lower abdominal pain EXAM: TRANSABDOMINAL AND TRANSVAGINAL ULTRASOUND OF PELVIS DOPPLER ULTRASOUND OF OVARIES TECHNIQUE: Both transabdominal and transvaginal ultrasound examinations of the  pelvis were performed. Transabdominal technique was performed for global imaging of the pelvis including uterus, ovaries, adnexal regions, and pelvic cul-de-sac. It was necessary to proceed with endovaginal exam following the transabdominal exam to visualize the ovaries and endometrium. Color and duplex Doppler ultrasound was utilized to evaluate blood flow to the ovaries. COMPARISON:  None Available. FINDINGS: Uterus Measurements: 8.1 x 4.1 x 5.7 cm = volume: 90.1 mL. No fibroids or other mass visualized. Endometrium Thickness: 1 mm.  No focal abnormality visualized. Right ovary Measurements: 2.7 x 0.8 x 1.4 cm = volume: 1.5 mL. Normal appearance/no adnexal mass. Left ovary Measurements: 2.3 x 1.3 x 2.3 cm = volume: 3.4 mL. Normal appearance/no adnexal mass. Pulsed Doppler evaluation of both ovaries demonstrates normal low-resistance arterial and venous waveforms. Other findings There is trace free fluid in the pelvis. IMPRESSION: 1. Trace free fluid in the pelvis. 2. Otherwise unremarkable pelvic ultrasound. Electronically Signed   By: Darliss Cheney M.D.   On: 11/22/2023 23:32   US Transvaginal Non-OB Result  Date: 11/22/2023 CLINICAL DATA:  Lower abdominal pain EXAM: TRANSABDOMINAL AND TRANSVAGINAL ULTRASOUND OF PELVIS DOPPLER ULTRASOUND OF OVARIES TECHNIQUE: Both transabdominal and transvaginal ultrasound examinations of the pelvis were performed. Transabdominal technique was performed for global imaging of the pelvis including uterus, ovaries, adnexal regions, and pelvic cul-de-sac. It was necessary to proceed with endovaginal exam following the transabdominal exam to visualize the ovaries and endometrium. Color and duplex Doppler ultrasound was utilized to evaluate blood flow to the ovaries. COMPARISON:  None Available. FINDINGS: Uterus Measurements: 8.1 x 4.1 x 5.7 cm = volume: 90.1 mL. No fibroids or other mass visualized. Endometrium Thickness: 1 mm.  No focal abnormality visualized. Right ovary Measurements: 2.7 x 0.8 x 1.4 cm = volume: 1.5 mL. Normal appearance/no adnexal mass. Left ovary Measurements: 2.3 x 1.3 x 2.3 cm = volume: 3.4 mL. Normal appearance/no adnexal mass. Pulsed Doppler evaluation of both ovaries demonstrates normal low-resistance arterial and venous waveforms. Other findings There is trace free fluid in the pelvis. IMPRESSION: 1. Trace free fluid in the pelvis. 2. Otherwise unremarkable pelvic ultrasound. Electronically Signed   By: Darliss Cheney M.D.   On: 11/22/2023 23:32   [place] Art/Ven Flow Abd Pelv Doppler Result Date: 11/22/2023 CLINICAL DATA:  Lower abdominal pain EXAM: TRANSABDOMINAL AND TRANSVAGINAL ULTRASOUND OF PELVIS DOPPLER ULTRASOUND OF OVARIES TECHNIQUE: Both transabdominal and transvaginal ultrasound examinations of the pelvis were performed. Transabdominal technique was performed for global imaging of the pelvis including uterus, ovaries, adnexal regions, and pelvic cul-de-sac. It was necessary to proceed with endovaginal exam following the transabdominal exam to visualize the ovaries and endometrium. Color and duplex Doppler ultrasound was utilized to evaluate blood flow to  the ovaries. COMPARISON:  None Available. FINDINGS: Uterus Measurements: 8.1 x 4.1 x 5.7 cm = volume: 90.1 mL. No fibroids or other mass visualized. Endometrium Thickness: 1 mm.  No focal abnormality visualized. Right ovary Measurements: 2.7 x 0.8 x 1.4 cm = volume: 1.5 mL. Normal appearance/no adnexal mass. Left ovary Measurements: 2.3 x 1.3 x 2.3 cm = volume: 3.4 mL. Normal appearance/no adnexal mass. Pulsed Doppler evaluation of both ovaries demonstrates normal low-resistance arterial and venous waveforms. Other findings There is trace free fluid in the pelvis. IMPRESSION: 1. Trace free fluid in the pelvis. 2. Otherwise unremarkable pelvic ultrasound. Electronically Signed   By: Darliss Cheney M.D.   On: 11/22/2023 23:32    Procedures Procedures    Medications Ordered in ED Medications  ketorolac (TORADOL) 30 MG/ML injection 30  mg (30 mg Intravenous Given 11/22/23 2153)  iohexol (OMNIPAQUE) 300 MG/ML solution 100 mL (100 mLs Intravenous Contrast Given 11/22/23 2314)    ED Course/ Medical Decision Making/ A&P                                 Medical Decision Making  Patient with abdominal pain prehospital, now resolved.  She is sleeping in the room, reports that her pain is no longer present.  Patient has had a thorough workup.  Blood work unremarkable.  Patient's CT scan unremarkable.  Ultrasound of pelvis including Doppler does not show any abnormality including no evidence of torsion.  Addendum: Patient refusing to give a urine.  No urinary symptoms.  Will discharge.      Final Clinical Impression(s) / ED Diagnoses Final diagnoses:  Generalized abdominal pain    Rx / DC Orders ED Discharge Orders     None         Venus Gilles, Canary Brim, MD 11/23/23 0028    Gilda Crease, MD 11/23/23 [phone]

## 2023-11-23 NOTE — ED Notes (Signed)
This RN awoke pt from resting to request pt to use the restroom to collect a urine specimen, pt states she cannot urinate. RN made pt aware that MD ordered In & Out. Pt refused and became verbally aggressive and threatening to staff. Pt made aware that behavior was unacceptable. MD made aware.

## 2023-11-30 ENCOUNTER — Other Ambulatory Visit: Payer: Self-pay

## 2023-11-30 ENCOUNTER — Emergency Department (HOSPITAL_COMMUNITY)
Admission: EM | Admit: 2023-11-30 | Discharge: 2023-11-30 | Disposition: A | Discharge status: Home / Self Care | Payer: Medicaid Other | Attending: Emergency Medicine | Admitting: Emergency Medicine

## 2023-11-30 DIAGNOSIS — Z20822 Contact with and (suspected) exposure to covid-19: Secondary | ICD-10-CM | POA: Insufficient documentation

## 2023-11-30 DIAGNOSIS — Z5900 Homelessness unspecified: Secondary | ICD-10-CM | POA: Diagnosis not present

## 2023-11-30 DIAGNOSIS — R109 Unspecified abdominal pain: Secondary | ICD-10-CM | POA: Diagnosis present

## 2023-11-30 LAB — CBC WITH DIFFERENTIAL/PLATELET
Abs Immature Granulocytes: 0.01 10*3/uL (ref 0.00–0.07)
Basophils Absolute: 0 10*3/uL (ref 0.0–0.1)
Basophils Relative: 0 %
Eosinophils Absolute: 0.1 10*3/uL (ref 0.0–0.5)
Eosinophils Relative: 1 %
HCT: 36 % (ref 36.0–46.0)
Hemoglobin: 11.4 g/dL — ABNORMAL LOW (ref 12.0–15.0)
Immature Granulocytes: 0 %
Lymphocytes Relative: 34 %
Lymphs Abs: 1.8 10*3/uL (ref 0.7–4.0)
MCH: 30.6 pg (ref 26.0–34.0)
MCHC: 31.7 g/dL (ref 30.0–36.0)
MCV: 96.8 fL (ref 80.0–100.0)
Monocytes Absolute: 0.6 10*3/uL (ref 0.1–1.0)
Monocytes Relative: 11 %
Neutro Abs: 2.8 10*3/uL (ref 1.7–7.7)
Neutrophils Relative %: 54 %
Platelets: 262 10*3/uL (ref 150–400)
RBC: 3.72 MIL/uL — ABNORMAL LOW (ref 3.87–5.11)
RDW: 14.4 % (ref 11.5–15.5)
WBC: 5.3 10*3/uL (ref 4.0–10.5)
nRBC: 0 % (ref 0.0–0.2)

## 2023-11-30 LAB — URINALYSIS, ROUTINE W REFLEX MICROSCOPIC
Bilirubin Urine: NEGATIVE
Glucose, UA: NEGATIVE mg/dL
Hgb urine dipstick: NEGATIVE
Ketones, ur: NEGATIVE mg/dL
Leukocytes,Ua: NEGATIVE
Nitrite: NEGATIVE
Protein, ur: NEGATIVE mg/dL
Specific Gravity, Urine: 1.026 (ref 1.005–1.030)
pH: 5 (ref 5.0–8.0)

## 2023-11-30 LAB — COMPREHENSIVE METABOLIC PANEL
ALT: 20 U/L (ref 0–44)
AST: 23 U/L (ref 15–41)
Albumin: 4 g/dL (ref 3.5–5.0)
Alkaline Phosphatase: 51 U/L (ref 38–126)
Anion gap: 8 (ref 5–15)
BUN: 20 mg/dL (ref 6–20)
CO2: 24 mmol/L (ref 22–32)
Calcium: 9.3 mg/dL (ref 8.9–10.3)
Chloride: 107 mmol/L (ref 98–111)
Creatinine, Ser: 0.49 mg/dL (ref 0.44–1.00)
GFR, Estimated: >60 mL/min (ref >60)
Glucose, Bld: 91 mg/dL (ref 70–99)
Potassium: 4 mmol/L (ref 3.5–5.1)
Sodium: 139 mmol/L (ref 135–145)
Total Bilirubin: 0.6 mg/dL (ref 0.0–1.2)
Total Protein: 7.3 g/dL (ref 6.5–8.1)

## 2023-11-30 LAB — PREGNANCY, URINE: Preg Test, Ur: NEGATIVE

## 2023-11-30 LAB — RESP PANEL BY RT-PCR (RSV, FLU A&B, COVID)  RVPGX2
Influenza A by PCR: NEGATIVE
Influenza B by PCR: NEGATIVE
Resp Syncytial Virus by PCR: NEGATIVE
SARS Coronavirus 2 by RT PCR: NEGATIVE

## 2023-11-30 NOTE — Discharge Instructions (Signed)
 You were seen for your abdominal pain in the emergency department.   At home, please Tylenol  for pain.    Check your MyChart online for the results of any tests that had not resulted by the time you left the emergency department.   Follow-up with your primary doctor in 2-3 days regarding your visit.    Return immediately to the emergency department if you experience any of the following: Worsening pain, fevers, vomiting, or any other concerning symptoms.    Thank you for visiting our Emergency Department. It was a pleasure taking care of you today.

## 2023-11-30 NOTE — ED Provider Notes (Signed)
 [place] EMERGENCY DEPARTMENT AT [facility] Provider Note   CSN: [id number] Arrival date & time: 11/30/23  0240     History  Chief Complaint  Patient presents with   Abdominal Pain    Sabrina Galloway is a 38 y.o. female.  38 year old female history of homelessness, anxiety, bipolar, and schizoaffective disorder who presents to the emergency department with multiple complaints.  Patient was seen on 11/22/2023 for abdominal pain.  Had a CT scan and ultrasound that were unremarkable.  Says that she started feeling poorly last night.  Initially was saying that she had abdominal and leg pain in triage.  Denies this to me and says that she is mostly having back pain and fatigue.  No injuries to her back.  No fevers.  Says it is on her right flank.  Does not come and go.  Has been sleeping in the exam room and does not want to provide additional history.       Home Medications Prior to Admission medications   Medication Sig Start Date End Date Taking? Authorizing Provider  ARIPiprazole  (ABILIFY ) 5 MG tablet Take 1 tablet (5 mg total) by mouth daily. Patient not taking: Reported on 05/06/2022 03/29/18   Wilkie Majel GORMAN, FNP  benztropine  (COGENTIN ) 1 MG tablet Take 1 tablet (1 mg total) by mouth daily. Patient not taking: Reported on 05/06/2022 03/29/18   Wilkie Majel GORMAN, FNP  emtricitabine -tenofovir  (TRUVADA) 200-300 MG tablet Take 1 tablet by mouth daily. Patient not taking: Reported on 05/06/2022 08/13/18   Kuppelweiser, Cassie L, RPH-CPP  FLUOXETINE HCL PO Take by mouth. Patient not taking: Reported on 05/06/2022    [provider]  hydrOXYzine  (ATARAX /VISTARIL ) 25 MG tablet Take 1 tablet (25 mg total) by mouth 3 (three) times daily as needed for anxiety (sleep). Patient not taking: Reported on 05/06/2022 03/29/18   Wilkie Majel GORMAN, FNP  ibuprofen  (ADVIL ) 600 MG tablet Take 1 tablet (600 mg total) by mouth every 6 (six) hours as needed. 10/04/21   Kehrli,  Kelsey F, PA-C  methocarbamol  (ROBAXIN ) 500 MG tablet Take 1 tablet (500 mg total) by mouth 2 (two) times daily as needed for muscle spasms. 05/06/22   Hazen Darryle BRAVO, FNP      Allergies    Vicodin [hydrocodone -acetaminophen ]    Review of Systems   Review of Systems  Physical Exam Updated Vital Signs BP 128/75 (BP Location: Left Arm)   Pulse 67   Temp 98.3 F (36.8 C) (Oral)   Resp 15   Ht 5' (1.524 m)   Wt 66 kg   SpO2 100%   BMI 28.42 kg/m  Physical Exam Vitals and nursing note reviewed.  Constitutional:      General: She is not in acute distress.    Appearance: She is well-developed.  HENT:     Head: Normocephalic and atraumatic.     Right Ear: External ear normal.     Left Ear: External ear normal.     Nose: Nose normal.  Eyes:     Extraocular Movements: Extraocular movements intact.     Conjunctiva/sclera: Conjunctivae normal.     Pupils: Pupils are equal, round, and reactive to light.  Cardiovascular:     Rate and Rhythm: Normal rate and regular rhythm.     Heart sounds: No murmur heard. Pulmonary:     Effort: Pulmonary effort is normal. No respiratory distress.     Breath sounds: Normal breath sounds.  Abdominal:     General: Abdomen is  flat. There is no distension.     Palpations: Abdomen is soft. There is no mass.     Tenderness: There is no abdominal tenderness. There is no right CVA tenderness, left CVA tenderness or guarding.  Musculoskeletal:     Cervical back: Normal range of motion and neck supple.     Right lower leg: No edema.     Left lower leg: No edema.  Skin:    General: Skin is warm and dry.  Neurological:     Mental Status: She is alert and oriented to person, place, and time. Mental status is at baseline.  Psychiatric:        Mood and Affect: Mood normal.     ED Results / Procedures / Treatments   Labs (all labs ordered are listed, but only abnormal results are displayed) Labs Reviewed  CBC WITH DIFFERENTIAL/PLATELET - Abnormal;  Notable for the following components:      Result Value   RBC 3.72 (*)    Hemoglobin 11.4 (*)    All other components within normal limits  URINALYSIS, ROUTINE W REFLEX MICROSCOPIC - Abnormal; Notable for the following components:   APPearance HAZY (*)    All other components within normal limits  RESP PANEL BY RT-PCR (RSV, FLU A&B, COVID)  [id number]  COMPREHENSIVE METABOLIC PANEL  PREGNANCY, URINE    EKG None  Radiology No results found.  Procedures Procedures    Medications Ordered in ED Medications - No data to display  ED Course/ Medical Decision Making/ A&P                                 Medical Decision Making Amount and/or Complexity of Data Reviewed Labs: ordered.   Sabrina Galloway is a 38 y.o. female with comorbidities that complicate the patient evaluation including homelessness, anxiety, bipolar, and schizoaffective disorder who presents to the emergency department with multiple complaints.   Initial Ddx:  Illnesses, secondary gain, abdominal abscess/infection, back pain, MSK pain  MDM/Course:  Patient presents emergency department with a variety of complaints.  Unfortunately her chief complaint appears to be shifting depending on who she is talking to.  Because of this I do have some concern that there may not be an emergent medical condition going on this may be motivated secondary gain from her homelessness.  Also reporting that she is generally ill recently.  With COVID and flu going around she had a flu swab that was negative.  A CBC and CMP, states that it did not get any polys.  On exam she is overall well-appearing.  Not septic currently.  Not have any significant abdominal tenderness to palpation and already had an CT scan on 11/22/2023 that did not reveal acute abnormalities.  Upon re-evaluation patient was stable.  Will have her follow-up with her primary doctor for continued evaluation.  This patient presents to the ED for concern of complaints listed in  HPI, this involves an extensive number of treatment options, and is a complaint that carries with it a high risk of complications and morbidity. Disposition including potential need for admission considered.   Dispo: DC Home. Return precautions discussed including, but not limited to, those listed in the AVS. Allowed pt time to ask questions which were answered fully prior to dc.  Records reviewed ED Visit Notes The following labs were independently interpreted: Chemistry and show no acute abnormality I personally reviewed and interpreted cardiac monitoring: normal  sinus rhythm  I personally reviewed and interpreted the pt's EKG: see above for interpretation  I have reviewed the patients home medications and made adjustments as needed Social Determinants of health:  Homelessness  Portions of this note were generated with Scientist, clinical (histocompatibility and immunogenetics). Dictation errors may occur despite best attempts at proofreading.     Final Clinical Impression(s) / ED Diagnoses Final diagnoses:  Abdominal pain, unspecified abdominal location  Homelessness    Rx / DC Orders ED Discharge Orders     None         Yolande Lamar BROCKS, MD 11/30/23 1121

## 2023-11-30 NOTE — ED Triage Notes (Signed)
 Pt BIBA with c.o abdominal and bil leg pain 10/10 x1hr PTA. Pt just seen on 1/30 for same complaints.  62-16-110(palpated)-94%RA

## 2024-01-23 ENCOUNTER — Other Ambulatory Visit: Payer: Self-pay

## 2024-01-23 ENCOUNTER — Emergency Department (HOSPITAL_COMMUNITY)
Admission: EM | Admit: 2024-01-23 | Discharge: 2024-01-24 | Disposition: A | Discharge status: Home / Self Care | Attending: Emergency Medicine | Admitting: Emergency Medicine

## 2024-01-23 ENCOUNTER — Encounter (HOSPITAL_COMMUNITY): Payer: Self-pay

## 2024-01-23 DIAGNOSIS — R110 Nausea: Secondary | ICD-10-CM

## 2024-01-23 DIAGNOSIS — R109 Unspecified abdominal pain: Secondary | ICD-10-CM | POA: Diagnosis present

## 2024-01-23 DIAGNOSIS — F1290 Cannabis use, unspecified, uncomplicated: Secondary | ICD-10-CM | POA: Diagnosis not present

## 2024-01-23 DIAGNOSIS — R112 Nausea with vomiting, unspecified: Secondary | ICD-10-CM | POA: Insufficient documentation

## 2024-01-23 DIAGNOSIS — N3000 Acute cystitis without hematuria: Secondary | ICD-10-CM | POA: Diagnosis not present

## 2024-01-23 LAB — URINALYSIS, ROUTINE W REFLEX MICROSCOPIC
Bilirubin Urine: NEGATIVE
Glucose, UA: NEGATIVE mg/dL
Hgb urine dipstick: NEGATIVE
Ketones, ur: 5 mg/dL — AB
Nitrite: NEGATIVE
Protein, ur: NEGATIVE mg/dL
Specific Gravity, Urine: 1.027 (ref 1.005–1.030)
pH: 5 (ref 5.0–8.0)

## 2024-01-23 LAB — CBC WITH DIFFERENTIAL/PLATELET
Abs Immature Granulocytes: 0.01 10*3/uL (ref 0.00–0.07)
Basophils Absolute: 0 10*3/uL (ref 0.0–0.1)
Basophils Relative: 0 %
Eosinophils Absolute: 0.1 10*3/uL (ref 0.0–0.5)
Eosinophils Relative: 1 %
HCT: 36.8 % (ref 36.0–46.0)
Hemoglobin: 12 g/dL (ref 12.0–15.0)
Immature Granulocytes: 0 %
Lymphocytes Relative: 36 %
Lymphs Abs: 2.1 10*3/uL (ref 0.7–4.0)
MCH: 31.3 pg (ref 26.0–34.0)
MCHC: 32.6 g/dL (ref 30.0–36.0)
MCV: 96.1 fL (ref 80.0–100.0)
Monocytes Absolute: 0.7 10*3/uL (ref 0.1–1.0)
Monocytes Relative: 12 %
Neutro Abs: 3 10*3/uL (ref 1.7–7.7)
Neutrophils Relative %: 51 %
Platelets: 232 10*3/uL (ref 150–400)
RBC: 3.83 MIL/uL — ABNORMAL LOW (ref 3.87–5.11)
RDW: 13.1 % (ref 11.5–15.5)
WBC: 5.8 10*3/uL (ref 4.0–10.5)
nRBC: 0 % (ref 0.0–0.2)

## 2024-01-23 LAB — COMPREHENSIVE METABOLIC PANEL WITH GFR
ALT: 18 U/L (ref 0–44)
AST: 21 U/L (ref 15–41)
Albumin: 4.2 g/dL (ref 3.5–5.0)
Alkaline Phosphatase: 51 U/L (ref 38–126)
Anion gap: 8 (ref 5–15)
BUN: 14 mg/dL (ref 6–20)
CO2: 26 mmol/L (ref 22–32)
Calcium: 9 mg/dL (ref 8.9–10.3)
Chloride: 105 mmol/L (ref 98–111)
Creatinine, Ser: 0.57 mg/dL (ref 0.44–1.00)
GFR, Estimated: >60 mL/min (ref >60)
Glucose, Bld: 86 mg/dL (ref 70–99)
Potassium: 3.8 mmol/L (ref 3.5–5.1)
Sodium: 139 mmol/L (ref 135–145)
Total Bilirubin: 0.8 mg/dL (ref 0.0–1.2)
Total Protein: 7.6 g/dL (ref 6.5–8.1)

## 2024-01-23 LAB — HCG, SERUM, QUALITATIVE: Preg, Serum: NEGATIVE

## 2024-01-23 LAB — LIPASE, BLOOD: Lipase: 29 U/L (ref 11–51)

## 2024-01-23 MED ORDER — SODIUM CHLORIDE 0.9 % IV SOLN
1.0000 g | Freq: Once | INTRAVENOUS | Status: AC
  Administered 2024-01-23: 1 g via INTRAVENOUS
  Filled 2024-01-23: qty 10

## 2024-01-23 MED ORDER — CEPHALEXIN 500 MG PO CAPS: 500.0000 mg | ORAL_CAPSULE | Freq: Two times a day (BID) | ORAL | 0 refills | Status: AC

## 2024-01-23 MED ORDER — ONDANSETRON HCL 4 MG/2ML IJ SOLN
4.0000 mg | Freq: Once | INTRAMUSCULAR | Status: AC
  Administered 2024-01-23: 4 mg via INTRAVENOUS
  Filled 2024-01-23: qty 2

## 2024-01-23 MED ORDER — LACTATED RINGERS IV BOLUS
1000.0000 mL | Freq: Once | INTRAVENOUS | Status: AC
  Administered 2024-01-23: 1000 mL via INTRAVENOUS

## 2024-01-23 MED ORDER — ONDANSETRON 4 MG PO TBDP
4.0000 mg | ORAL_TABLET | Freq: Three times a day (TID) | ORAL | 0 refills | Status: AC | PRN
Start: 2024-01-23 — End: ?

## 2024-01-23 NOTE — ED Triage Notes (Signed)
 BIB EMS from a motel for lower abd pain that started last night. Pt endorses nausea, denies vomiting or diarrhea. Pt endorse marijuana use tonight, denies ETOH. Pt denies medication to treat symptoms, and states she does not like to take medications.

## 2024-01-23 NOTE — ED Provider Notes (Signed)
 [facility] Emergency Department Provider Note MRN:  [id number]  Arrival date & time: 01/23/24     Chief Complaint   Abdominal Pain   History of Present Illness   Sabrina Galloway is a 38 y.o. year-old female presents to the ED with chief complaint of nausea and vomiting.  She states that she has been having the symptoms for the past couple of days.  Reports associated marijuana use.  She states that she has had some crampy abdominal discomfort.  She denies any diarrhea.  Denies history of cyclical vomiting syndrome secondary to cannabis.  Denies any successful treatments prior to arrival.  Denies fevers, chills, cough.  History provided by patient.   Review of Systems  Pertinent positive and negative review of systems noted in HPI.    Physical Exam   Vitals:   01/23/24 2152  BP: 113/68  Pulse: 66  Resp: 17  Temp: 98.2 F (36.8 C)  SpO2: 100%    CONSTITUTIONAL:  non toxic-appearing, NAD NEURO:  Alert and oriented x 3, CN 3-12 grossly intact EYES:  eyes equal and reactive ENT/NECK:  Supple, no stridor  CARDIO:  normal rate, regular rhythm, appears well-perfused  PULM:  No respiratory distress, CTAB GI/GU:  non-distended,  MSK/SPINE:  No gross deformities, no edema, moves all extremities  SKIN:  no rash, atraumatic   *Additional and/or pertinent findings included in MDM below  Diagnostic and Interventional Summary    EKG Interpretation Date/Time:    Ventricular Rate:    PR Interval:    QRS Duration:    QT Interval:    QTC Calculation:   R Axis:      Text Interpretation:         Labs Reviewed  CBC WITH DIFFERENTIAL/PLATELET - Abnormal; Notable for the following components:      Result Value   RBC 3.83 (*)    All other components within normal limits  URINALYSIS, ROUTINE W REFLEX MICROSCOPIC - Abnormal; Notable for the following components:   APPearance HAZY (*)    Ketones, ur 5 (*)    Leukocytes,Ua MODERATE (*)    Bacteria, UA  MANY (*)    All other components within normal limits  COMPREHENSIVE METABOLIC PANEL WITH GFR  LIPASE, BLOOD  HCG, SERUM, QUALITATIVE    No orders to display    Medications  cefTRIAXone (ROCEPHIN) 1 g in sodium chloride 0.9 % 100 mL IVPB (has no administration in time range)  lactated ringers bolus 1,000 mL (1,000 mLs Intravenous New Bag/Given 01/23/24 2242)  ondansetron (ZOFRAN) injection 4 mg (4 mg Intravenous Given 01/23/24 2241)     Procedures  /  Critical Care Procedures  ED Course and Medical Decision Making  I have reviewed the triage vital signs, the nursing notes, and pertinent available records from the EMR.  Social Determinants Affecting Complexity of Care: Patient has no clinically significant social determinants affecting this chief complaint..   ED Course: Clinical Course as of 01/23/24 2322  Wed Jan 23, 2024  2320 Urinalysis, Routine w reflex microscopic -Urine, Clean Catch(!) Abnormal, will treat for UTI.  Given IV rocephin. [RB]  2321 CBC with Diff(!) No significant leukocytosis or anemia [RB]  2321 Comprehensive metabolic panel No marked electrolyte abnormality [RB]  2321 Lipase, blood Normal lipase [RB]  2321 hCG, serum, qualitative Pregnancy test negative [RB]    Clinical Course User Index [RB] Roxy Horseman, PA-C    Medical Decision Making Patient here with n/v for the past day or two.  No fevers.  No focal abdominal tenderness.  Doubt surgical or acute abdomen.  UA consistent with UTI.  No flank pain or fever.  Doubt pyelo.  Doubt KS.  Will treat with rocephin in ED and DC home on keflex.  Return precautions discussed.  Amount and/or Complexity of Data Reviewed Labs: ordered. Decision-making details documented in ED Course.  Risk Prescription drug management.         Consultants: No consultations were needed in caring for this patient.   Treatment and Plan: Emergency department workup does not suggest an emergent condition requiring  admission or immediate intervention beyond  what has been performed at this time. The patient is safe for discharge and has  been instructed to return immediately for worsening symptoms, change in  symptoms or any other concerns    Final Clinical Impressions(s) / ED Diagnoses     ICD-10-CM   1. Nausea  R11.0     2. Nausea and vomiting, unspecified vomiting type  R11.2     3. Acute cystitis without hematuria  N30.00       ED Discharge Orders          Ordered    cephALEXin (KEFLEX) 500 MG capsule  2 times daily        01/23/24 2320    ondansetron (ZOFRAN-ODT) 4 MG disintegrating tablet  Every 8 hours PRN        01/23/24 2320              Discharge Instructions Discussed with and Provided to Patient:   Discharge Instructions   None      Roxy Horseman, PA-C 01/23/24 2322    Glynn Octave, MD 01/24/24 [phone]

## 2024-02-04 ENCOUNTER — Other Ambulatory Visit: Payer: Self-pay

## 2024-02-04 ENCOUNTER — Emergency Department (HOSPITAL_COMMUNITY)
Admission: EM | Admit: 2024-02-04 | Discharge: 2024-02-05 | Discharge status: Against Medical Advice | Attending: Emergency Medicine | Admitting: Emergency Medicine

## 2024-02-04 DIAGNOSIS — Z5321 Procedure and treatment not carried out due to patient leaving prior to being seen by health care provider: Secondary | ICD-10-CM | POA: Diagnosis not present

## 2024-02-04 DIAGNOSIS — M545 Low back pain, unspecified: Secondary | ICD-10-CM | POA: Diagnosis not present

## 2024-02-04 DIAGNOSIS — R102 Pelvic and perineal pain: Secondary | ICD-10-CM | POA: Insufficient documentation

## 2024-02-04 LAB — URINALYSIS, ROUTINE W REFLEX MICROSCOPIC
Bilirubin Urine: NEGATIVE
Glucose, UA: NEGATIVE mg/dL
Hgb urine dipstick: NEGATIVE
Ketones, ur: NEGATIVE mg/dL
Nitrite: NEGATIVE
Protein, ur: NEGATIVE mg/dL
Specific Gravity, Urine: 1.028 (ref 1.005–1.030)
pH: 5 (ref 5.0–8.0)

## 2024-02-04 LAB — COMPREHENSIVE METABOLIC PANEL WITH GFR
ALT: 19 U/L (ref 0–44)
AST: 25 U/L (ref 15–41)
Albumin: 4.2 g/dL (ref 3.5–5.0)
Alkaline Phosphatase: 49 U/L (ref 38–126)
Anion gap: 7 (ref 5–15)
BUN: 17 mg/dL (ref 6–20)
CO2: 25 mmol/L (ref 22–32)
Calcium: 8.9 mg/dL (ref 8.9–10.3)
Chloride: 107 mmol/L (ref 98–111)
Creatinine, Ser: 0.68 mg/dL (ref 0.44–1.00)
GFR, Estimated: >60 mL/min (ref >60)
Glucose, Bld: 84 mg/dL (ref 70–99)
Potassium: 3.9 mmol/L (ref 3.5–5.1)
Sodium: 139 mmol/L (ref 135–145)
Total Bilirubin: 0.5 mg/dL (ref 0.0–1.2)
Total Protein: 7.2 g/dL (ref 6.5–8.1)

## 2024-02-04 LAB — CBC
HCT: 36.9 % (ref 36.0–46.0)
Hemoglobin: 11.7 g/dL — ABNORMAL LOW (ref 12.0–15.0)
MCH: 31.4 pg (ref 26.0–34.0)
MCHC: 31.7 g/dL (ref 30.0–36.0)
MCV: 98.9 fL (ref 80.0–100.0)
Platelets: 227 10*3/uL (ref 150–400)
RBC: 3.73 MIL/uL — ABNORMAL LOW (ref 3.87–5.11)
RDW: 12.9 % (ref 11.5–15.5)
WBC: 4.2 10*3/uL (ref 4.0–10.5)
nRBC: 0 % (ref 0.0–0.2)

## 2024-02-04 LAB — HCG, SERUM, QUALITATIVE: Preg, Serum: NEGATIVE

## 2024-02-04 LAB — LIPASE, BLOOD: Lipase: 29 U/L (ref 11–51)

## 2024-02-04 NOTE — ED Triage Notes (Signed)
 Pt BIBA. C/o pelvic pain and lower back pain for 1x day.

## 2024-02-05 NOTE — ED Notes (Signed)
Pt called x 3 , no answer

## 2024-02-06 ENCOUNTER — Encounter (HOSPITAL_COMMUNITY): Payer: Self-pay | Admitting: Emergency Medicine

## 2024-02-06 ENCOUNTER — Other Ambulatory Visit: Payer: Self-pay

## 2024-02-06 ENCOUNTER — Emergency Department (HOSPITAL_COMMUNITY)
Admission: EM | Admit: 2024-02-06 | Discharge: 2024-02-06 | Disposition: A | Discharge status: Home / Self Care | Attending: Emergency Medicine | Admitting: Emergency Medicine

## 2024-02-06 DIAGNOSIS — R110 Nausea: Secondary | ICD-10-CM | POA: Diagnosis not present

## 2024-02-06 DIAGNOSIS — N898 Other specified noninflammatory disorders of vagina: Secondary | ICD-10-CM | POA: Diagnosis not present

## 2024-02-06 DIAGNOSIS — R197 Diarrhea, unspecified: Secondary | ICD-10-CM | POA: Diagnosis not present

## 2024-02-06 DIAGNOSIS — R1030 Lower abdominal pain, unspecified: Secondary | ICD-10-CM | POA: Insufficient documentation

## 2024-02-06 LAB — WET PREP, GENITAL
Clue Cells Wet Prep HPF POC: NONE SEEN
Sperm: NONE SEEN
Trich, Wet Prep: NONE SEEN
WBC, Wet Prep HPF POC: <10 (ref <10)
Yeast Wet Prep HPF POC: NONE SEEN

## 2024-02-06 LAB — COMPREHENSIVE METABOLIC PANEL WITH GFR
ALT: 20 U/L (ref 0–44)
AST: 23 U/L (ref 15–41)
Albumin: 3.7 g/dL (ref 3.5–5.0)
Alkaline Phosphatase: 43 U/L (ref 38–126)
Anion gap: 6 (ref 5–15)
BUN: 15 mg/dL (ref 6–20)
CO2: 26 mmol/L (ref 22–32)
Calcium: 8.8 mg/dL — ABNORMAL LOW (ref 8.9–10.3)
Chloride: 108 mmol/L (ref 98–111)
Creatinine, Ser: 0.58 mg/dL (ref 0.44–1.00)
GFR, Estimated: >60 mL/min (ref >60)
Glucose, Bld: 96 mg/dL (ref 70–99)
Potassium: 4.2 mmol/L (ref 3.5–5.1)
Sodium: 140 mmol/L (ref 135–145)
Total Bilirubin: 0.5 mg/dL (ref 0.0–1.2)
Total Protein: 6.6 g/dL (ref 6.5–8.1)

## 2024-02-06 LAB — CBC
HCT: 34.2 % — ABNORMAL LOW (ref 36.0–46.0)
Hemoglobin: 11.1 g/dL — ABNORMAL LOW (ref 12.0–15.0)
MCH: 31.5 pg (ref 26.0–34.0)
MCHC: 32.5 g/dL (ref 30.0–36.0)
MCV: 97.2 fL (ref 80.0–100.0)
Platelets: 209 10*3/uL (ref 150–400)
RBC: 3.52 MIL/uL — ABNORMAL LOW (ref 3.87–5.11)
RDW: 13 % (ref 11.5–15.5)
WBC: 4.3 10*3/uL (ref 4.0–10.5)
nRBC: 0 % (ref 0.0–0.2)

## 2024-02-06 LAB — GC/CHLAMYDIA PROBE AMP (~~LOC~~) NOT AT ARMC
Chlamydia: NEGATIVE
Comment: NEGATIVE
Comment: NORMAL
Neisseria Gonorrhea: NEGATIVE

## 2024-02-06 MED ORDER — KETOROLAC TROMETHAMINE 60 MG/2ML IM SOLN
60.0000 mg | Freq: Once | INTRAMUSCULAR | Status: AC
  Administered 2024-02-06: 60 mg via INTRAMUSCULAR
  Filled 2024-02-06: qty 2

## 2024-02-06 NOTE — ED Triage Notes (Signed)
 Pt BIBA c/o ABD pain, N/V/D, back pain x1wk. Denies CP and fever. Was seen yesterday for same complaint and left AMA. A&Ox4   BP 148/92  HR 74  RR 19  O2 98%

## 2024-02-06 NOTE — ED Provider Notes (Signed)
 [place] EMERGENCY DEPARTMENT AT [facility] Provider Note   CSN: [id number] Arrival date & time: 02/06/24  7829     History  Chief Complaint  Patient presents with   Abdominal Pain    Sabrina Galloway is a 38 y.o. female.  The history is provided by the patient.  Abdominal Pain Sabrina Galloway is a 38 y.o. female who presents to the Emergency Department complaining of lower abdominal pain that started yesterday.  Pain is throughout her lower abdominal region and radiates to her entire low back.  Pain is constant.  She gets nauseated when she smells things.  No vomiting.  No fever.  She had 1 episode of diarrhea today.  No dysuria.  She does report some vaginal discharge that started today. She is sexually active.    Home Medications Prior to Admission medications   Medication Sig Start Date End Date Taking? Authorizing Provider  ARIPiprazole (ABILIFY) 5 MG tablet Take 1 tablet (5 mg total) by mouth daily. Patient not taking: Reported on 05/06/2022 03/29/18   Rella Cardinal, FNP  benztropine (COGENTIN) 1 MG tablet Take 1 tablet (1 mg total) by mouth daily. Patient not taking: Reported on 05/06/2022 03/29/18   Rella Cardinal, FNP  cephALEXin (KEFLEX) 500 MG capsule Take 1 capsule (500 mg total) by mouth 2 (two) times daily. 01/23/24   Sherel Dikes, PA-C  emtricitabine-tenofovir (TRUVADA) 200-300 MG tablet Take 1 tablet by mouth daily. Patient not taking: Reported on 05/06/2022 08/13/18   Kuppelweiser, Cassie L, RPH-CPP  FLUOXETINE HCL PO Take by mouth. Patient not taking: Reported on 05/06/2022    [provider]  hydrOXYzine (ATARAX/VISTARIL) 25 MG tablet Take 1 tablet (25 mg total) by mouth 3 (three) times daily as needed for anxiety (sleep). Patient not taking: Reported on 05/06/2022 03/29/18   Rella Cardinal, FNP  ibuprofen (ADVIL) 600 MG tablet Take 1 tablet (600 mg total) by mouth every 6 (six) hours as needed. 10/04/21   Kehrli, Kelsey F,  PA-C  methocarbamol (ROBAXIN) 500 MG tablet Take 1 tablet (500 mg total) by mouth 2 (two) times daily as needed for muscle spasms. 05/06/22   Dodson Freestone, FNP  ondansetron (ZOFRAN-ODT) 4 MG disintegrating tablet Take 1 tablet (4 mg total) by mouth every 8 (eight) hours as needed for nausea or vomiting. 01/23/24   Sherel Dikes, PA-C      Allergies    Vicodin [hydrocodone-acetaminophen]    Review of Systems   Review of Systems  Gastrointestinal:  Positive for abdominal pain.  All other systems reviewed and are negative.   Physical Exam Updated Vital Signs BP 117/71 (BP Location: Left Arm)   Pulse 75   Temp 98.1 F (36.7 C) (Oral)   Resp 17   LMP 01/02/2024 (Approximate)   SpO2 100%  Physical Exam Vitals and nursing note reviewed.  Constitutional:      Appearance: She is well-developed.  HENT:     Head: Normocephalic and atraumatic.  Cardiovascular:     Rate and Rhythm: Normal rate and regular rhythm.  Pulmonary:     Effort: Pulmonary effort is normal. No respiratory distress.  Abdominal:     Palpations: Abdomen is soft.     Tenderness: There is no guarding or rebound.     Comments: Minimal lower abdominal tenderness  Genitourinary:    Comments: Scant vaginal bleeding.  No CMT.  No significant vaginal discharge. Musculoskeletal:        General: No tenderness.  Skin:  General: Skin is warm and dry.  Neurological:     Mental Status: She is alert and oriented to person, place, and time.     Comments: 5 out of 5 strength in all 4 extremities with sensation to light touch intact in all 4 extremities  Psychiatric:        Behavior: Behavior normal.     ED Results / Procedures / Treatments   Labs (all labs ordered are listed, but only abnormal results are displayed) Labs Reviewed  COMPREHENSIVE METABOLIC PANEL WITH GFR - Abnormal; Notable for the following components:      Result Value   Calcium 8.8 (*)    All other components within normal limits  CBC -  Abnormal; Notable for the following components:   RBC 3.52 (*)    Hemoglobin 11.1 (*)    HCT 34.2 (*)    All other components within normal limits  WET PREP, GENITAL  RPR  HIV ANTIBODY (ROUTINE TESTING W REFLEX)  GC/CHLAMYDIA PROBE AMP ([place]) NOT AT [facility]    EKG None  Radiology No results found.  Procedures Procedures    Medications Ordered in ED Medications  ketorolac (TORADOL) injection 60 mg (60 mg Intramuscular Given 02/06/24 0455)    ED Course/ Medical Decision Making/ A&P                                 Medical Decision Making Amount and/or Complexity of Data Reviewed Labs: ordered.  Risk Prescription drug management.   Patient here for evaluation of lower abdominal pain that started yesterday.  She has minimal tenderness on examination.  She does report vaginal discharge.  No significant discharge on examination.  Current clinical picture is not consistent with appendicitis, torsion, diverticulitis, PID.  Labs are reassuring.  She was treated with ketorolac in the emergency department with resolution of her symptoms.  Feel she is stable for discharge home with outpatient follow-up and return precautions.        Final Clinical Impression(s) / ED Diagnoses Final diagnoses:  Lower abdominal pain    Rx / DC Orders ED Discharge Orders     None         Kelsey Patricia, MD 02/06/24 [phone]

## 2024-02-07 LAB — RPR: RPR Ser Ql: NONREACTIVE

## 2024-02-07 LAB — MISC LABCORP TEST (SEND OUT): Labcorp test code: 83935
